# Patient Record
Sex: Male | Born: 1980 | Race: Asian | Hispanic: No | Marital: Married | Smoking: Current every day smoker
Health system: Southern US, Community
[De-identification: ages and names within clinical notes are randomized; demographics above are authoritative.]

## PROBLEM LIST (undated history)

## (undated) DIAGNOSIS — Z720 Tobacco use: Secondary | ICD-10-CM

## (undated) HISTORY — DX: Tobacco use: Z72.0

## (undated) HISTORY — PX: CHOLECYSTECTOMY: SHX55

---

## 2020-08-24 DIAGNOSIS — Z419 Encounter for procedure for purposes other than remedying health state, unspecified: Secondary | ICD-10-CM | POA: Diagnosis not present

## 2020-09-11 ENCOUNTER — Telehealth: Payer: Self-pay

## 2020-09-11 NOTE — Telephone Encounter (Signed)
Error

## 2020-09-11 NOTE — Congregational Nurse Program (Signed)
  Dept: [phone]   Congregational Nurse Program Note  Date of Encounter: 09/11/2020  Past Medical History: No past medical history on file.  Encounter Details: patient came to establish care with congregational nurse.Noted patient has appointment with refugee clinic on 11/13/20. Health education provided on diet and exercise.  Arman Bogus RN BSn PCCN  [facility] [phone]

## 2020-09-23 DIAGNOSIS — Z419 Encounter for procedure for purposes other than remedying health state, unspecified: Secondary | ICD-10-CM | POA: Diagnosis not present

## 2020-10-24 DIAGNOSIS — Z419 Encounter for procedure for purposes other than remedying health state, unspecified: Secondary | ICD-10-CM | POA: Diagnosis not present

## 2020-11-13 ENCOUNTER — Other Ambulatory Visit (HOSPITAL_COMMUNITY)
Admission: RE | Admit: 2020-11-13 | Discharge: 2020-11-13 | Disposition: A | Discharge status: Home / Self Care | Payer: Medicaid Other | Source: Ambulatory Visit | Attending: Family Medicine | Admitting: Family Medicine

## 2020-11-13 ENCOUNTER — Ambulatory Visit (INDEPENDENT_AMBULATORY_CARE_PROVIDER_SITE_OTHER): Payer: Medicaid Other | Admitting: Family Medicine

## 2020-11-13 ENCOUNTER — Other Ambulatory Visit: Payer: Self-pay

## 2020-11-13 VITALS — BP 126/74 | HR 74 | Ht 67.5 in | Wt 185.6 lb

## 2020-11-13 DIAGNOSIS — Z720 Tobacco use: Secondary | ICD-10-CM | POA: Diagnosis not present

## 2020-11-13 DIAGNOSIS — S0592XS Unspecified injury of left eye and orbit, sequela: Secondary | ICD-10-CM

## 2020-11-13 DIAGNOSIS — R9389 Abnormal findings on diagnostic imaging of other specified body structures: Secondary | ICD-10-CM | POA: Diagnosis not present

## 2020-11-13 DIAGNOSIS — Z0289 Encounter for other administrative examinations: Secondary | ICD-10-CM | POA: Insufficient documentation

## 2020-11-13 DIAGNOSIS — Z5941 Food insecurity: Secondary | ICD-10-CM

## 2020-11-13 DIAGNOSIS — Z131 Encounter for screening for diabetes mellitus: Secondary | ICD-10-CM

## 2020-11-13 DIAGNOSIS — Z114 Encounter for screening for human immunodeficiency virus [HIV]: Secondary | ICD-10-CM

## 2020-11-13 DIAGNOSIS — E639 Nutritional deficiency, unspecified: Secondary | ICD-10-CM | POA: Diagnosis not present

## 2020-11-13 DIAGNOSIS — Z1159 Encounter for screening for other viral diseases: Secondary | ICD-10-CM | POA: Diagnosis not present

## 2020-11-13 DIAGNOSIS — Z6825 Body mass index (BMI) 25.0-25.9, adult: Secondary | ICD-10-CM | POA: Diagnosis not present

## 2020-11-13 DIAGNOSIS — Z113 Encounter for screening for infections with a predominantly sexual mode of transmission: Secondary | ICD-10-CM

## 2020-11-13 LAB — POCT URINALYSIS DIP (MANUAL ENTRY)
Bilirubin, UA: NEGATIVE
Blood, UA: NEGATIVE
Glucose, UA: NEGATIVE mg/dL
Ketones, POC UA: NEGATIVE mg/dL
Leukocytes, UA: NEGATIVE
Nitrite, UA: NEGATIVE
Protein Ur, POC: NEGATIVE mg/dL
Spec Grav, UA: 1.025 (ref 1.010–1.025)
Urobilinogen, UA: 0.2 E.U./dL
pH, UA: 5.5 (ref 5.0–8.0)

## 2020-11-13 NOTE — Assessment & Plan Note (Signed)
Normal exam and no symptoms.  May have been artifact or transient infection.  Sputum cultures overseas were negative this makes tuberculosis much less likely.  Repeat chest x-ray ordered and we discussed with him how to get there.

## 2020-11-13 NOTE — Assessment & Plan Note (Signed)
Smokes 20 cigarettes of more per day and has smoked 24 years. Discussed the importance of quitting, will discuss further options at next appointment.

## 2020-11-13 NOTE — Assessment & Plan Note (Signed)
Discussed healthcare model.  Given food insecurity food box given.  Routine labs today.  Will need updated vaccines at follow-up.

## 2020-11-13 NOTE — Progress Notes (Signed)
  Patient Name: Donald Andrade Date of Birth: 1980/09/30 Date of Visit: 11/13/20 PCP: Lurline Del, DO  Chief Complaint: refugee intake examination and vision concerns.   The patient's preferred language is Arabic. An interpreter was used for the entire visit.  Interpreter Name or ID: Juanda Crumble   Subjective: Donald Andrade is a pleasant 40 y.o. presenting today for an initial refugee and immigrant clinic visit.   Concerns include history of kidney stones and difficulty of vision of left eye. No pain today in region of his kidneys  ROS: Poor vision in left eye. Has headaches on occasion but improve on own. Endorses dyspnea when sleeping, wakes him from sleep, happens once every month or two. No chest pain, does have some stomach bloating and some stomach pain.  PMH: Two times going to ER in [place] for stomach pain, never admitted. It has been better lately.   PSH: He had a perianal cyst I&D years ago. Gallbladder removed 2021.  FH: Mother diabetes and HTN - died from diabetes  Father HTN  Allergies:  None known   Current Medications:  none   Social History: Tobacco Use: "a bucket" of cigarettes per day. 20 per day. Started 24 years ago.  Alcohol Use: None  In the past two weeks, have you run out of food before you had money to purchase more? Yes  In the past two weeks, have you had difficulty with obtaining food for your family? 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 Dentition is poor . Appears well hydrated. Neck: Supple Cardiac: Regular rate and rhythm. Normal S1/S2. No murmurs, rubs, or gallops appreciated. Lungs: Clear bilaterally to ascultation.  Abdomen: Normoactive bowel sounds. Generalized mild abdominal discomfort. No rebound or guarding. Splenomegaly not present  Extremities: Warm, well perfused without edema.  Skin: No obvious rashes or lesions  Psych: Pleasant and appropriate  MSK: appropriate muscle tone    Food insecurity CCM referral place, food box given today  I have personally updated the history tabs within Epic and included the refugee information in social documentation.    Designated Advertising account planner signed with agency - Yes.   Release of information signed for Health Department - Yes.   Return to care in 1 month in [facility] with resident physician and PCP.   Vaccines: Hep B, MMR, Covid19 x2, Td

## 2020-11-13 NOTE — Assessment & Plan Note (Signed)
Hemoglobin A1c today.  The patient is at risk due to his family history and overweight status.

## 2020-11-13 NOTE — Assessment & Plan Note (Signed)
CCM referral place, food box given today

## 2020-11-13 NOTE — Patient Instructions (Addendum)
It was wonderful to see you today.  Please bring ALL of your medications with you to every visit.   Today we talked about:  -- Calling the eye doctor for an appointment--we will talk with Hollice Espy about this  --Calling the dentist for an appointment  --  We placed a referral for our CCM group to assist with food difficulties and are giving you a food box.    --At our next appointment we will talk more about quitting smoking. It is important to cut down on the number of cigarettes you are smoking per day    Thank you for choosing [facility].   Please call [phone] with any questions about today's appointment.  Please be sure to schedule follow up at the front  desk before you leave today.   Terisa Starr, MD  Family Medicine

## 2020-11-14 LAB — URINE CYTOLOGY ANCILLARY ONLY
Chlamydia: NEGATIVE
Comment: NEGATIVE
Comment: NORMAL
Neisseria Gonorrhea: NEGATIVE

## 2020-11-15 ENCOUNTER — Telehealth: Payer: Self-pay | Admitting: Family Medicine

## 2020-11-15 NOTE — Telephone Encounter (Signed)
Called to schedule patient's eye appointment.  This was scheduled at [facility] eye care.  Date is August 24.  Time is 8:15 AM.  Address is [address] [place].  Dr. Atha Starks can you please give the patient this information at his follow-up visit.  I already informed his caseworker.  Terisa Starr, MD  Family Medicine Teaching Service

## 2020-11-16 ENCOUNTER — Ambulatory Visit
Admission: RE | Admit: 2020-11-16 | Discharge: 2020-11-16 | Disposition: A | Discharge status: Home / Self Care | Payer: Medicaid Other | Source: Ambulatory Visit | Attending: Family Medicine | Admitting: Family Medicine

## 2020-11-16 DIAGNOSIS — R9389 Abnormal findings on diagnostic imaging of other specified body structures: Secondary | ICD-10-CM

## 2020-11-16 DIAGNOSIS — Z720 Tobacco use: Secondary | ICD-10-CM

## 2020-11-17 LAB — LIPID PANEL
Chol/HDL Ratio: 5.4 ratio — ABNORMAL HIGH (ref 0.0–5.0)
Cholesterol, Total: 147 mg/dL (ref 100–199)
HDL: 27 mg/dL — ABNORMAL LOW (ref >39)
LDL Chol Calc (NIH): 84 mg/dL (ref 0–99)
Triglycerides: 210 mg/dL — ABNORMAL HIGH (ref 0–149)
VLDL Cholesterol Cal: 36 mg/dL (ref 5–40)

## 2020-11-17 LAB — CBC WITH DIFFERENTIAL/PLATELET
Basophils Absolute: 0.1 10*3/uL (ref 0.0–0.2)
Basos: 1 %
EOS (ABSOLUTE): 0.4 10*3/uL (ref 0.0–0.4)
Eos: 6 %
Hematocrit: 45.8 % (ref 37.5–51.0)
Hemoglobin: 15.6 g/dL (ref 13.0–17.7)
Immature Grans (Abs): 0 10*3/uL (ref 0.0–0.1)
Immature Granulocytes: 0 %
Lymphocytes Absolute: 2.6 10*3/uL (ref 0.7–3.1)
Lymphs: 35 %
MCH: 29.2 pg (ref 26.6–33.0)
MCHC: 34.1 g/dL (ref 31.5–35.7)
MCV: 86 fL (ref 79–97)
Monocytes Absolute: 0.4 10*3/uL (ref 0.1–0.9)
Monocytes: 6 %
Neutrophils Absolute: 3.8 10*3/uL (ref 1.4–7.0)
Neutrophils: 52 %
Platelets: 218 10*3/uL (ref 150–450)
RBC: 5.34 x10E6/uL (ref 4.14–5.80)
RDW: 13.1 % (ref 11.6–15.4)
WBC: 7.3 10*3/uL (ref 3.4–10.8)

## 2020-11-17 LAB — COMPREHENSIVE METABOLIC PANEL
ALT: 21 IU/L (ref 0–44)
AST: 18 IU/L (ref 0–40)
Albumin/Globulin Ratio: 2.3 — ABNORMAL HIGH (ref 1.2–2.2)
Albumin: 4.9 g/dL (ref 4.0–5.0)
Alkaline Phosphatase: 92 IU/L (ref 44–121)
BUN/Creatinine Ratio: 17 (ref 9–20)
BUN: 12 mg/dL (ref 6–20)
Bilirubin Total: 0.5 mg/dL (ref 0.0–1.2)
CO2: 24 mmol/L (ref 20–29)
Calcium: 9.7 mg/dL (ref 8.7–10.2)
Chloride: 100 mmol/L (ref 96–106)
Creatinine, Ser: 0.72 mg/dL — ABNORMAL LOW (ref 0.76–1.27)
Globulin, Total: 2.1 g/dL (ref 1.5–4.5)
Glucose: 91 mg/dL (ref 65–99)
Potassium: 4.4 mmol/L (ref 3.5–5.2)
Sodium: 140 mmol/L (ref 134–144)
Total Protein: 7 g/dL (ref 6.0–8.5)
eGFR: 120 mL/min/{1.73_m2} (ref >59)

## 2020-11-17 LAB — HIV ANTIBODY (ROUTINE TESTING W REFLEX): HIV Screen 4th Generation wRfx: NONREACTIVE

## 2020-11-17 LAB — TSH: TSH: 1.2 u[IU]/mL (ref 0.450–4.500)

## 2020-11-17 LAB — HCV AB W REFLEX TO QUANT PCR: HCV Ab: <0.1 s/co ratio (ref 0.0–0.9)

## 2020-11-17 LAB — HCV INTERPRETATION

## 2020-11-17 LAB — HGB FRACTIONATION CASCADE
Hgb A2: 2.8 % (ref 1.8–3.2)
Hgb A: 97.2 % (ref 96.4–98.8)
Hgb F: 0 % (ref 0.0–2.0)
Hgb S: 0 %

## 2020-11-17 LAB — HEMOGLOBIN A1C
Est. average glucose Bld gHb Est-mCnc: 114 mg/dL
Hgb A1c MFr Bld: 5.6 % (ref 4.8–5.6)

## 2020-11-17 LAB — HEPATITIS B SURFACE ANTIBODY, QUANTITATIVE: Hepatitis B Surf Ab Quant: 24.2 m[IU]/mL (ref >9.9)

## 2020-11-17 LAB — QUANTIFERON-TB GOLD PLUS
QuantiFERON Mitogen Value: >10 IU/mL
QuantiFERON Nil Value: 0.03 IU/mL
QuantiFERON TB1 Ag Value: 0.03 IU/mL
QuantiFERON TB2 Ag Value: 0.04 IU/mL
QuantiFERON-TB Gold Plus: NEGATIVE

## 2020-11-17 LAB — VARICELLA ZOSTER ANTIBODY, IGG: Varicella zoster IgG: 1369 index (ref >165)

## 2020-11-17 LAB — HEPATITIS B SURFACE ANTIGEN: Hepatitis B Surface Ag: NEGATIVE

## 2020-11-17 LAB — HEPATITIS B CORE ANTIBODY, TOTAL: Hep B Core Total Ab: NEGATIVE

## 2020-11-17 LAB — RPR: RPR Ser Ql: NONREACTIVE

## 2020-11-19 ENCOUNTER — Telehealth: Payer: Self-pay | Admitting: Family Medicine

## 2020-11-19 NOTE — Telephone Encounter (Signed)
   Telephone encounter was:  Successful.  11/19/2020 Name: [name] MRN: [id number] DOB: Oct 06, 1980  Donald Andrade is a 39 y.o. year old male who is a primary care patient of Jackelyn Poling, DO . The community resource team was consulted for assistance with Food Insecurity  Care guide performed the following interventions: Patient provided with information about care guide support team and interviewed to confirm resource needs Discussed resources to assist with local food banks. I gave pt four locations to call for assistance. He confirmed he would have a friend translate .  Follow Up Plan:  No further follow up planned at this time. The patient has been provided with needed resources.  April Green Care Guide, Embedded Care Coordination [facility], Care Management Phone: [phone] Email: [email or web address]

## 2020-11-23 DIAGNOSIS — Z419 Encounter for procedure for purposes other than remedying health state, unspecified: Secondary | ICD-10-CM | POA: Diagnosis not present

## 2020-12-03 ENCOUNTER — Ambulatory Visit: Payer: Medicaid Other | Admitting: Family Medicine

## 2020-12-24 DIAGNOSIS — Z419 Encounter for procedure for purposes other than remedying health state, unspecified: Secondary | ICD-10-CM | POA: Diagnosis not present

## 2020-12-25 NOTE — Progress Notes (Deleted)
    SUBJECTIVE:   CHIEF COMPLAINT / HPI:   Lab follow-up: Patient is a 40 year old male presenting today for follow-up after initial labs which were collected during his previous visit. During his previous encounter we checked a CBC, lipid profile, CMP, hemoglobin electrophoresis, A1c, TSH, chlamydia/gonorrhea/RPR, QuantiFERON gold, varicella-zoster, hep B panel, hepatitis C, HIV, urinalysis, and chest x-ray were performed.  There were no gross abnormalities with any of these labs other than an HDL of 27 and moderately elevated triglycerides, though this was not fasted lab.  Tobacco cessation: Was previously smoking 20+ cigarettes per day with a 24-year history of smoking.  At the last appointment we did discuss reducing this.  Today he states he is currently smoking***.  Epigastric pain: Was discussed at previous encounter.  Seems consistent with reflux.***.  PERTINENT  PMH / PSH: None relevant  OBJECTIVE:   There were no vitals taken for this visit. ***  General: NAD, pleasant, able to participate in exam Cardiac: RRR, no murmurs. Respiratory: CTAB, normal effort Abdomen: Bowel sounds present, nontender, nondistended, no hepatosplenomegaly. Psych: Normal affect and mood   ASSESSMENT/PLAN:   No problem-specific Assessment & Plan notes found for this encounter.   Recommended diet and exercise for reduced HDL.  Also discussed smoking cessation.  Discussed smoking cessation.  Patient currently smoking***. Pneumococcal 20 vaccine given***  Has eye doctor follow-up on August 24.  Discussed this with the patient and have placed this in his AVS.  We will check H. pylori screen for concern for H. pylori causing gastritis as the cause of patient's epigastric discomfort.  Can consider starting omeprazole for 8 weeks after screen is complete***   Jackelyn Poling, DO [facility]    {    This will disappear when note is signed, click to select method of visit     :1}

## 2020-12-26 ENCOUNTER — Ambulatory Visit: Payer: Medicaid Other | Admitting: Family Medicine

## 2020-12-26 DIAGNOSIS — S0592XS Unspecified injury of left eye and orbit, sequela: Secondary | ICD-10-CM

## 2020-12-26 DIAGNOSIS — Z720 Tobacco use: Secondary | ICD-10-CM

## 2020-12-26 DIAGNOSIS — R748 Abnormal levels of other serum enzymes: Secondary | ICD-10-CM

## 2020-12-26 DIAGNOSIS — Z09 Encounter for follow-up examination after completed treatment for conditions other than malignant neoplasm: Secondary | ICD-10-CM

## 2021-01-15 NOTE — Progress Notes (Signed)
    SUBJECTIVE:   CHIEF COMPLAINT / HPI:   Lab follow-up: Patient is a 40 year old male presenting today for follow-up after initial labs which were collected during his previous visit. During his previous encounter we checked a CBC, lipid profile, CMP, hemoglobin electrophoresis, A1c, TSH, chlamydia/gonorrhea/RPR, QuantiFERON gold, varicella-zoster, hep B panel, hepatitis C, HIV, urinalysis, and chest x-ray were performed.  There were no gross abnormalities with any of these labs other than an HDL of 27 and moderately elevated triglycerides, though this was not fasted lab.  Tobacco cessation: Was previously smoking 20+ cigarettes per day with a 24-year history of smoking.  At the last appointment we did discuss reducing this.  Today he states he is currently smoking about 20 cigarettes per day.  Epigastric pain: Was discussed at previous encounter.  Burning sensation that often happens after he eats, especially with spicy foods. Seems consistent with reflux. No associated dyspnea. Plan to check H pylori screen today.  PERTINENT  PMH / PSH: None relevant  OBJECTIVE:   BP 123/70   Pulse 76   Ht 5' 7.5" (1.715 m)   Wt 186 lb 3.2 oz (84.5 kg)   SpO2 100%   BMI 28.73 kg/m    General: NAD, pleasant, able to participate in exam Chest/cardiac: RRR, no murmurs.  Mild epigastric discomfort with palpation.  Patient states this reproduces his pain. Respiratory: CTAB, normal effort Abdomen: Bowel sounds present, nontender, nondistended, no hepatosplenomegaly. Psych: Normal affect and mood   ASSESSMENT/PLAN:   Epigastric discomfort We will check H. pylori screen for concern for H. pylori causing gastritis as the cause of patient's epigastric discomfort.  Will start omeprazole for 8 weeks after screen is complete.  I have sent this to his pharmacy.  Tobacco use Discussed smoking cessation options and techniques both working on reducing the nicotine cravings and the habits of smoking.  Spoke  with the patient in great detail about various modalities for reducing smoking.  Patient currently smoking 20+ cigarettes per day. Pneumococcal 20 vaccine given. Will start nicotine patches and gum.  Have discussed in detail how to use these appropriately.  Patient will use a nicotine patch each day and use gum as needed to reduce cravings.  I discussed the proper use of the nicotine gum with him via interpreter.  Follow up in 1 month.   Reduced HDL: Recommended diet and exercise for reduced HDL.  Also discussed smoking cessation.  Vision abnormalities: Had eye doctor follow-up this morning which he states went well with him being prescribed glasses.   Jackelyn Poling, DO [facility]

## 2021-01-16 ENCOUNTER — Ambulatory Visit (INDEPENDENT_AMBULATORY_CARE_PROVIDER_SITE_OTHER): Payer: Medicaid Other | Admitting: Family Medicine

## 2021-01-16 ENCOUNTER — Other Ambulatory Visit (HOSPITAL_COMMUNITY): Payer: Self-pay

## 2021-01-16 ENCOUNTER — Encounter: Payer: Self-pay | Admitting: Family Medicine

## 2021-01-16 ENCOUNTER — Other Ambulatory Visit: Payer: Self-pay

## 2021-01-16 VITALS — BP 123/70 | HR 76 | Ht 67.5 in | Wt 186.2 lb

## 2021-01-16 DIAGNOSIS — E786 Lipoprotein deficiency: Secondary | ICD-10-CM

## 2021-01-16 DIAGNOSIS — F17200 Nicotine dependence, unspecified, uncomplicated: Secondary | ICD-10-CM

## 2021-01-16 DIAGNOSIS — H539 Unspecified visual disturbance: Secondary | ICD-10-CM

## 2021-01-16 DIAGNOSIS — R1013 Epigastric pain: Secondary | ICD-10-CM | POA: Insufficient documentation

## 2021-01-16 DIAGNOSIS — Z720 Tobacco use: Secondary | ICD-10-CM

## 2021-01-16 DIAGNOSIS — Z23 Encounter for immunization: Secondary | ICD-10-CM | POA: Diagnosis not present

## 2021-01-16 MED ORDER — NICOTINE 21 MG/24HR TD PT24
21.0000 mg | MEDICATED_PATCH | Freq: Every morning | TRANSDERMAL | 1 refills | Status: AC
  Filled 2021-01-16: qty 14, 14d supply, fill #0

## 2021-01-16 MED ORDER — NICOTINE POLACRILEX 4 MG MT GUM: 4.0000 mg | CHEWING_GUM | OROMUCOSAL | 0 refills | Status: AC | PRN

## 2021-01-16 MED ORDER — NICOTINE POLACRILEX 4 MG MT GUM
4.0000 mg | CHEWING_GUM | OROMUCOSAL | 1 refills | Status: AC | PRN
  Filled 2021-01-16: qty 110, 12d supply, fill #0

## 2021-01-16 MED ORDER — NICOTINE 21 MG/24HR TD PT24: 21.0000 mg | MEDICATED_PATCH | Freq: Every day | TRANSDERMAL | 0 refills | Status: AC

## 2021-01-16 MED ORDER — OMEPRAZOLE 20 MG PO CPDR
20.0000 mg | DELAYED_RELEASE_CAPSULE | Freq: Every day | ORAL | 0 refills | Status: AC
  Filled 2021-01-16: qty 60, 60d supply, fill #0

## 2021-01-16 NOTE — Assessment & Plan Note (Signed)
We will check H. pylori screen for concern for H. pylori causing gastritis as the cause of patient's epigastric discomfort.  Will start omeprazole for 8 weeks after screen is complete.  I have sent this to his pharmacy.

## 2021-01-16 NOTE — Patient Instructions (Addendum)
We are starting the nicotine patches and the nicotine gum.  I would like to use the patch each day and you can chew a piece of nicotine gum anytime you have a craving for smoking.  I would like to start this on the first day that she would like to quit smoking and try to avoid using any cigarettes throughout the day.  Remember with the gum the more you chew it to the more nicotine it releases to reduce those cravings.  I would like to see back in 1 month  We are going to check a lab today to see if you have a bacteria that can cause the burning in your stomach.  I will let you know the result of this when it returns.  We are going to start a medication to help with your stomach burning in the meantime.  We will use this medication for at least 2 months.  You will take it each day.  I have sent this to the pharmacy.

## 2021-01-16 NOTE — Assessment & Plan Note (Addendum)
Discussed smoking cessation options and techniques both working on reducing the nicotine cravings and the habits of smoking.  Spoke with the patient in great detail about various modalities for reducing smoking.  Patient currently smoking 20+ cigarettes per day. Pneumococcal 20 vaccine given. Will start nicotine patches and gum.  Have discussed in detail how to use these appropriately.  Patient will use a nicotine patch each day and use gum as needed to reduce cravings.  I discussed the proper use of the nicotine gum with him via interpreter.  Follow up in 1 month.

## 2021-01-16 NOTE — Addendum Note (Signed)
Addended by: Cleatrice Burke A on: 01/16/2021 04:04 PM   Modules accepted: Orders

## 2021-01-18 LAB — H. PYLORI BREATH TEST: H pylori Breath Test: POSITIVE — AB

## 2021-01-21 ENCOUNTER — Other Ambulatory Visit (HOSPITAL_COMMUNITY): Payer: Self-pay

## 2021-01-21 ENCOUNTER — Other Ambulatory Visit: Payer: Self-pay | Admitting: Family Medicine

## 2021-01-21 DIAGNOSIS — A048 Other specified bacterial intestinal infections: Secondary | ICD-10-CM

## 2021-01-21 MED ORDER — PYLERA 140-125-125 MG PO CAPS
3.0000 | ORAL_CAPSULE | Freq: Three times a day (TID) | ORAL | 0 refills | Status: AC
  Filled 2021-01-21: qty 120, 10d supply, fill #0

## 2021-01-21 NOTE — Progress Notes (Signed)
Called patient with use of arabic interpretor [id number].   Explained to patient that he was positive for H Pylori as we expected. I explained that I had ordered a group of medications for him to treat this infection and that we had set up a pharmacy appointment with Dr. Nicholaus Bloom on Tuesday September 6th at 11am to discuss his treatment. He plans to pick up the medication from the [facility] pharmacy and will start treatment after coming to the pharmacy appointment on September 6th.

## 2021-01-24 DIAGNOSIS — Z419 Encounter for procedure for purposes other than remedying health state, unspecified: Secondary | ICD-10-CM | POA: Diagnosis not present

## 2021-01-29 ENCOUNTER — Other Ambulatory Visit (HOSPITAL_COMMUNITY): Payer: Self-pay

## 2021-01-29 ENCOUNTER — Other Ambulatory Visit: Payer: Self-pay

## 2021-01-29 ENCOUNTER — Ambulatory Visit (INDEPENDENT_AMBULATORY_CARE_PROVIDER_SITE_OTHER): Payer: Medicaid Other | Admitting: Pharmacist

## 2021-01-29 DIAGNOSIS — A048 Other specified bacterial intestinal infections: Secondary | ICD-10-CM

## 2021-01-29 NOTE — Patient Instructions (Addendum)
???? ?????? ??? ?????! ?????? ????? ?? ?????? ???????. ????? ????? ???? ?????? ??? ???? ???? ?? ?????. ????? ???? ??????? ??? ??????? ? ????? ??????? ??? ?????? ? ????? ??????? ??? ?????? ? ????? ??????? ??? ?????. ????? ????? ?????? ??????? ???? ???? ?? ?????? ??? ?????? ?????? ?? ?????? ??? ????? ?????? ? ?????? ????? ??????. ????? ?????? ?? ??? ??? ????? ?????? ?????? ??? ??????? ???? ?????? ????   10 ????. ????? ?????? ?????? ?????? ? ????? ?????? ??? ????? ??? ?????? ??? ????? ?? ?????.   ??? ??? ???? ?? ????? ?????? ?? ??? ????? ??? ?? ?????? ?????? ? ???? ??????? ???????? ??? [phone]  sarirt biruyatik sayid kanean! tahadathna alyawm ean 'adwiatik aljadidati. satakhudh alshakhs aladhi 'ahdarath maeak 'arbae maraat fi alyawmi. satakhudh thalath kabsulat baed al'iiftar , wathalath kabsulat baed alghada' , wathalath kabsulat baed aleasha' , wathalath kabsulat qabl alnuwmi. satakhudh aydan aldawa' al'iidafia aladhi ladayk fi almanzil wahu kabsulat wbdlaan min tanawulih maratan wahidatan ywmyan , satakhudhuh maratayn ywmyan. [place]ul[place] fi nafs waqt tanawul aldawa' aljadid baed al'iiftar wabaed aleasha' limudat 10 'ayaamu. bimujarad aiktimal al'ayaam aleashrat , [place] aleawdat 'iilaa tanawul hadha aldawa' maratan wahidatan fi alyawmi. 'iidha kan ladayk 'ayu 'asyilat 'iidafiat 'aw kunt bihajat 'iilaa 'ayi musaeadat 'iidafiat , yurjaa alaitisal bialeiadat ealaa [phone]  It was nice seeing you Donald Andrade!  Today we talked about your new medications. You will take the one you brought with you four times a day. You will take three capsules after breakfast, three capsules after lunch, three capsules after dinner, and three capsules at bedtime. You will also take the additional medication you have at home that is a capsule and instead of taking once a day you will take it twice a day. You can take it at the same time as the new medication after breakfast and after dinner for 10 days. Once the  10 days are completed you can go back to taking this medication once a day.    If you have any additional questions or need any additional help please call the clinic at [phone]

## 2021-01-29 NOTE — Progress Notes (Signed)
   Subjective:    Patient ID: Donald Andrade, male    DOB: April 07, 1981, 40 y.o.   MRN: [id number]  HPI  Patient is a 40 y.o. male who presents for medication review for H. Pylori treatment. He is in good spirits and presents without assistance. Utilized translator [phone]. Patient was referred on 01/21/21 and last seen by Primary Care Provider on 01/16/21.  Patient presents with Pylera pill packages. Patient has not begun treatment yet as instructed by primary care provider to wait until appointment today.   Objective:   Labs:   Physical Exam Neurological:     Mental Status: He is alert and oriented to person, place, and time.    Assessment/Plan:   Patient educated on purpose, proper use and potential adverse effects of Pylera. Instructed patient to take omeprazole 20mg  twice a day alongside Pylera for 10 days. Following instruction patient verbalized understanding of treatment plan.   Follow-up appointment with provider as necessary. Written patient instructions provided.  This appointment required 25 minutes of direct patient care.  Thank you for involving pharmacy to assist in providing this patient's care.  Patient seen with , PharmD Candidate

## 2021-02-23 DIAGNOSIS — Z419 Encounter for procedure for purposes other than remedying health state, unspecified: Secondary | ICD-10-CM | POA: Diagnosis not present

## 2021-03-26 DIAGNOSIS — Z419 Encounter for procedure for purposes other than remedying health state, unspecified: Secondary | ICD-10-CM | POA: Diagnosis not present

## 2021-04-25 DIAGNOSIS — Z419 Encounter for procedure for purposes other than remedying health state, unspecified: Secondary | ICD-10-CM | POA: Diagnosis not present

## 2021-05-09 ENCOUNTER — Other Ambulatory Visit: Payer: Self-pay

## 2021-05-09 ENCOUNTER — Other Ambulatory Visit (HOSPITAL_COMMUNITY)
Admission: RE | Admit: 2021-05-09 | Discharge: 2021-05-09 | Disposition: A | Discharge status: Home / Self Care | Payer: Medicaid Other | Source: Ambulatory Visit | Attending: Family Medicine | Admitting: Family Medicine

## 2021-05-09 ENCOUNTER — Ambulatory Visit (INDEPENDENT_AMBULATORY_CARE_PROVIDER_SITE_OTHER): Payer: Medicaid Other | Admitting: Family Medicine

## 2021-05-09 ENCOUNTER — Other Ambulatory Visit (HOSPITAL_COMMUNITY): Payer: Self-pay

## 2021-05-09 VITALS — BP 139/85 | HR 91 | Ht 67.5 in | Wt 189.8 lb

## 2021-05-09 DIAGNOSIS — Z113 Encounter for screening for infections with a predominantly sexual mode of transmission: Secondary | ICD-10-CM | POA: Diagnosis not present

## 2021-05-09 DIAGNOSIS — R37 Sexual dysfunction, unspecified: Secondary | ICD-10-CM | POA: Diagnosis not present

## 2021-05-09 DIAGNOSIS — Z77011 Contact with and (suspected) exposure to lead: Secondary | ICD-10-CM

## 2021-05-09 MED ORDER — SILDENAFIL CITRATE 50 MG PO TABS
50.0000 mg | ORAL_TABLET | Freq: Every day | ORAL | 0 refills | Status: DC | PRN
  Filled 2021-05-09: qty 10, 30d supply, fill #0

## 2021-05-09 NOTE — Progress Notes (Signed)
° °  SUBJECTIVE:   CHIEF COMPLAINT / HPI:    A Arabic interpreter was used for this encounter:  Name: Ahmed ID #[id number]   Donald Andrade is a 40 y.o. male here to discuss sexual problem.   Pt reports it takes a long time to have sex. He does not feel the urge to have sex any longer. "No lust to have sex" and has difficulty getting an erection for the past month and a half. Has had been thinking about the war back home and family.  Does have a morning erection but not its not as hard as it use to be. He can get an erection but loses his erection during sex with his wife. This has been going on for a while now.  He ejaculated last week and felt a pulling pain in his right testicle. Notes this has been occurring for a while as well. Denies dysuria, abdominal pain, hematuria, fever, back pain.   He worked in [employer] for the past 4 months which told them he was been exposed to lead for the past 4 months. Request lead testing.     PERTINENT  PMH / PSH: reviewed and updated as appropriate   OBJECTIVE:   BP 139/85    Pulse 91    Ht 5' 7.5" (1.715 m)    Wt 189 lb 12.8 oz (86.1 kg)    SpO2 99%    BMI 29.29 kg/m    Physical Exam Vitals reviewed.  Constitutional:      Appearance: Normal appearance. He is not ill-appearing.  Cardiovascular:     Rate and Rhythm: Normal rate.  Pulmonary:     Effort: Pulmonary effort is normal. No respiratory distress.  Abdominal:     Hernia: There is no hernia in the left inguinal area or right inguinal area.  Genitourinary:    Pubic Area: No rash.      Penis: Normal and circumcised. No tenderness, discharge, swelling or lesions.      Testes: Normal. Cremasteric reflex is present.        Right: Mass or tenderness not present.        Left: Mass or tenderness not present.     Epididymis:     Right: No tenderness.     Left: No tenderness.  Neurological:     Mental Status: He is alert.     ASSESSMENT/PLAN:   Sexual dysfunction Obtain  testosterone level, GC/CT, RPR, HIV testing. No tenderness or hernias, on exam.  Highly suspicious this may be supratentorial given refugee status and reports of thinking of the war in the past. Discussed trial of erectile dysfunction medication and pt agreeable. Rx for Viagra 50 mg sent to pharmacy. Reviewed medication instructions and adverse effects with patient while interpreter present.     Lead Exposure  Given workplace exposure obtain venous lead sample and CBC.   Katha Cabal, DO PGY-3, [facility] 05/10/2021

## 2021-05-09 NOTE — Patient Instructions (Signed)
???? ??? ???????? ???? ??????? ?????? ?????? ??. ?? ????? ?????? ??? ?????? ?????. ° °??? ??? ???? ??? ??? ????? ?? ??? ? ????? ??. ° °?? ????? ?? ??????? ?? ???   [phone].  ?????    Stop by the pharmacy to pick up your prescriptions.  Take 1 tablet one hour prior to sex.   If something is abnormal with your bloodwork, I will call you.    Feel free to call me at [phone].   Take care

## 2021-05-10 DIAGNOSIS — R37 Sexual dysfunction, unspecified: Secondary | ICD-10-CM | POA: Insufficient documentation

## 2021-05-10 LAB — URINE CYTOLOGY ANCILLARY ONLY
Chlamydia: NEGATIVE
Comment: NEGATIVE
Comment: NEGATIVE
Comment: NORMAL
Neisseria Gonorrhea: NEGATIVE
Trichomonas: NEGATIVE

## 2021-05-10 NOTE — Assessment & Plan Note (Signed)
Obtain testosterone level, GC/CT, RPR, HIV testing. No tenderness or hernias, on exam.  Highly suspicious this may be supratentorial given refugee status and reports of thinking of the war in the past. Discussed trial of erectile dysfunction medication and pt agreeable. Rx for Viagra 50 mg sent to pharmacy. Reviewed medication instructions and adverse effects with patient while interpreter present.

## 2021-05-12 LAB — CBC
Hematocrit: 42.9 % (ref 37.5–51.0)
Hemoglobin: 15.1 g/dL (ref 13.0–17.7)
MCH: 28.9 pg (ref 26.6–33.0)
MCHC: 35.2 g/dL (ref 31.5–35.7)
MCV: 82 fL (ref 79–97)
Platelets: 260 10*3/uL (ref 150–450)
RBC: 5.23 x10E6/uL (ref 4.14–5.80)
RDW: 12.6 % (ref 11.6–15.4)
WBC: 7.2 10*3/uL (ref 3.4–10.8)

## 2021-05-12 LAB — LEAD, BLOOD (ADULT >= 16 YRS): Lead-Whole Blood: 57.1 ug/dL (ref 0.0–3.4)

## 2021-05-12 LAB — TESTOSTERONE, FREE, DIRECT
Testosterone, Free: 6.2 pg/mL — ABNORMAL LOW (ref 8.7–25.1)
Testosterone, Total, LC/MS: 346.9 ng/dL (ref 264.0–916.0)

## 2021-05-12 LAB — HIV ANTIBODY (ROUTINE TESTING W REFLEX): HIV Screen 4th Generation wRfx: NONREACTIVE

## 2021-05-13 ENCOUNTER — Other Ambulatory Visit: Payer: Self-pay

## 2021-05-13 ENCOUNTER — Telehealth: Payer: Self-pay

## 2021-05-13 ENCOUNTER — Other Ambulatory Visit (HOSPITAL_COMMUNITY): Payer: Self-pay

## 2021-05-13 ENCOUNTER — Ambulatory Visit (INDEPENDENT_AMBULATORY_CARE_PROVIDER_SITE_OTHER): Payer: Medicaid Other | Admitting: Family Medicine

## 2021-05-13 ENCOUNTER — Encounter: Payer: Self-pay | Admitting: Family Medicine

## 2021-05-13 VITALS — BP 123/71 | HR 88 | Ht 67.5 in | Wt 191.1 lb

## 2021-05-13 DIAGNOSIS — Z77011 Contact with and (suspected) exposure to lead: Secondary | ICD-10-CM

## 2021-05-13 MED ORDER — CALCIUM GLUCONATE 500 MG PO TABS
1.0000 | ORAL_TABLET | Freq: Three times a day (TID) | ORAL | 1 refills | Status: AC
  Filled 2021-05-13: qty 90, 30d supply, fill #0

## 2021-05-13 NOTE — Progress Notes (Signed)
° ° °  SUBJECTIVE:   CHIEF COMPLAINT / HPI: discuss recent labs  Patient recently seen for decreased libido and concern for lead exposure.  Recent Lead noted to be 57.1.  Free Testosterone low 6.2.  Viagra helping.  Patient works at [employer] and exposed to lead.  Denies any nausea/vomiting, confusion or weakness.  Reports some tingling in fingers bilaterally.  PERTINENT  PMH / PSH:  Recurrent exposure Lead  OBJECTIVE:   BP 123/71    Pulse 88    Ht 5' 7.5" (1.715 m)    Wt 191 lb 2 oz (86.7 kg)    SpO2 99%    BMI 29.49 kg/m    General: Alert, no acute distress Cardio: Normal S1 and S2, RRR, no r/m/g Pulm: CTAB, normal work of breathing Abdomen: Bowel sounds normal. Abdomen soft and non-tender.  Extremities: No peripheral edema.  Neuro: Cranial nerves grossly intact   ASSESSMENT/PLAN:   Lead exposure Lead levels elevated 57.1.  Called poison control, who discussed with toxicologist, Dr Cyndia Bent Coty, and recommended to have patient follow up with employer for parameters.  Called [employer]SHA and spoke with Clydie Braun, RN, who recommended to start patient on Calcium and have family tested for lead exposure.  She will follow up with patient and company.  Recomended to follow up with patient in 1 month for repeat lea level. Discussed with patient importance of removal from area of lead exposure at work.  Can apply for workers comp if unable to work in safe area, per [employer]. Encouraged patient to shower immediately after work to avoid transfer to family. Educated that should wait until lead levels below 30 for family planning and likely testosterone levels will return to normal once exposure limited CMet today  Calcium 500 mg TID Follow up with PCP in 1 month   Due to language barrier, an interpreter was present during the history-taking and subsequent discussion (and for part of the physical exam) with this patient.   Dana Allan, MD [facility]

## 2021-05-13 NOTE — Telephone Encounter (Signed)
Patient calls nurse line requesting to speak with nurse regarding recent lab results. Prior to speaking with nursing staff, patient scheduled follow up appointment in clinic to discuss lab results and next steps.   Advised patient that results had not yet been reviewed by provider, therefore, he can keep his appointment this afternoon to discuss further.   Will forward to Dr. Clent Ridges as she is seeing patient this afternoon.   Veronda Prude, RN

## 2021-05-13 NOTE — Telephone Encounter (Signed)
Donald Andrade with [facility] calls nurse line to reports lead level of 57.1. Donald Andrade reports she attempted to reach out to the patient to discuss possible exposures, however was not able to reach patient.   Patient has an apt scheduled with Clent Ridges this afternoon. Donald Andrade reports more than likely the high value is from spices in cooking, however where he works could also be a factor and a "question worth asking." Information given on arrival to [place] from [place].   Will forward to provider who saw patient and Clent Ridges for afternoon apt.

## 2021-05-13 NOTE — Patient Instructions (Addendum)
Thank you for coming to see me today. It was a pleasure. Today we talked about:   Call the Clydie Braun at [facility] Department at [phone] and tell them you are returning a call to discuss high lead level found in your blood.  Follow up with your employer to see if they have any parameters or guidelines for lead exposure.  Start Calcium 500 mg three times a day  You will need to have your children tested for led exposure Please make appointments for blood work  We will get some labs today.  If they are abnormal or we need to do something about them, I will call you.  If they are normal, I will send you a message on MyChart (if it is active) or a letter in the mail.  If you don't hear from [place] in 2 weeks, please call the office at the number below.   Important to work in different area to limit lead exposure.   Please follow-up with PCP in 1 month for repeat blood work  If you have any questions or concerns, please do not hesitate to call the office at [phone].  Best,   Dana Allan, MD    Lead Blood Test Why am I having this test? Lead is a substance that occurs naturally in the earth and is very poisonous (toxic) to humans. Exposure to high amounts of lead can result in lead building up in tissues throughout the body, especially in the brain and nervous system. This is called lead poisoning. This test may be done: As a screening test for children who do not have symptoms of lead poisoning. This is often done to detect lead exposure as early as possible to prevent lead poisoning. If you have possible symptoms of lead poisoning, such as: Mood changes. Frequent headaches. Repeated miscarriages or early (preterm) deliveries, if applicable. Signs of a learning impairment or developmental delay in children. Decreased mental function. Muscle weakness. Memory loss. If you have already been diagnosed with lead poisoning, and your health care provider is monitoring your  treatment. What is being tested? This test measures the amount of lead in the blood. What kind of sample is taken? A blood sample is required for this test. It is usually collected by inserting a needle into a blood vessel. Tell a health care provider about: Any allergies you have. All medicines you are taking, including vitamins, herbs, eye drops, creams, and over-the-counter medicines. Any blood disorders you have. Any surgeries you have had. Any medical conditions you have. Whether you are pregnant or may be pregnant. How are the results reported? Your test results will be reported as a value that tells you how much lead is in your blood. This will be given as micrograms of lead per deciliter of blood (mcg/dL). Your health care provider will compare your results to normal values that were established after testing a large group of people (reference values). Reference values may vary among labs and hospitals. For this test, a common reference value is less than 5 mcg/dL. What do the results mean? For children: A result of 0 mcg/dL is considered negative, meaning that there is no lead in the blood. If your child's result is higher than the reference value, this may mean that your child has been exposed to lead recently, or that he or she has lead poisoning. Your health care provider will work with you to develop a plan to monitor and treat your child and to prevent  continued or future exposure of your child to lead. For adults: A result of 0 mcg/dL is considered negative, meaning that there is no lead in the blood. If your result is lower than the reference value, this means that you have an acceptable amount of lead in your body. This may mean that you do not have lead poisoning, or that your treatment for lead poisoning is working well. If your result is higher than the reference value, this means that you have too much lead in your body. This may mean that you have been exposed to lead  recently, or that you have lead poisoning. Your health care provider will determine what type of treatment you need based on the amount of lead in your blood and your symptoms. Talk with your health care provider about what your results mean. Questions to ask your health care provider Ask your health care provider, or the department that is doing the test: When will my results be ready? How will I get my results? What are my treatment options? What other tests do I need? What are my next steps? Summary Lead is a substance that occurs naturally in the earth and is very poisonous (toxic) to humans. Exposure to high amounts of lead can result in lead building up in tissues throughout the body, especially in the brain and nervous system. This is called lead poisoning. This test measures the amount of lead in your blood. If your results show a high amount of lead in your body, you may have been exposed to lead recently or have lead poisoning. Your treatment will depend on how severe your condition is. Talk with your health care provider about what your results mean. This information is not intended to replace advice given to you by your health care provider. Make sure you discuss any questions you have with your health care provider. Document Revised: 01/17/2021 Document Reviewed: 01/13/2020 Elsevier Patient Education  2022 [employer].

## 2021-05-14 ENCOUNTER — Encounter: Payer: Self-pay | Admitting: Family Medicine

## 2021-05-14 LAB — COMPREHENSIVE METABOLIC PANEL
ALT: 36 IU/L (ref 0–44)
AST: 20 IU/L (ref 0–40)
Albumin/Globulin Ratio: 2.2 (ref 1.2–2.2)
Albumin: 5.1 g/dL — ABNORMAL HIGH (ref 4.0–5.0)
Alkaline Phosphatase: 89 IU/L (ref 44–121)
BUN/Creatinine Ratio: 14 (ref 9–20)
BUN: 11 mg/dL (ref 6–20)
Bilirubin Total: 0.3 mg/dL (ref 0.0–1.2)
CO2: 24 mmol/L (ref 20–29)
Calcium: 9.5 mg/dL (ref 8.7–10.2)
Chloride: 101 mmol/L (ref 96–106)
Creatinine, Ser: 0.8 mg/dL (ref 0.76–1.27)
Globulin, Total: 2.3 g/dL (ref 1.5–4.5)
Glucose: 87 mg/dL (ref 70–99)
Potassium: 4.5 mmol/L (ref 3.5–5.2)
Sodium: 139 mmol/L (ref 134–144)
Total Protein: 7.4 g/dL (ref 6.0–8.5)
eGFR: 116 mL/min/{1.73_m2} (ref >59)

## 2021-05-15 LAB — RPR W/REFLEX TO TREPSURE: RPR: NONREACTIVE

## 2021-05-15 LAB — T PALLIDUM ANTIBODY, EIA: T pallidum Antibody, EIA: NEGATIVE

## 2021-05-19 ENCOUNTER — Encounter: Payer: Self-pay | Admitting: Family Medicine

## 2021-05-19 DIAGNOSIS — Z77011 Contact with and (suspected) exposure to lead: Secondary | ICD-10-CM | POA: Insufficient documentation

## 2021-05-19 NOTE — Assessment & Plan Note (Signed)
Lead levels elevated 57.1.  Called poison control, who discussed with toxicologist, Dr Cyndia Bent Coty, and recommended to have patient follow up with employer for parameters.  Called [employer] and spoke with Clydie Braun, RN, who recommended to start patient on Calcium and have family tested for lead exposure.  She will follow up with patient and company.  Recomended to follow up with patient in 1 month for repeat lea level. Discussed with patient importance of removal from area of lead exposure at work.  Can apply for workers comp if unable to work in safe area, per [employer]. Encouraged patient to shower immediately after work to avoid transfer to family. Educated that should wait until lead levels below 30 for family planning and likely testosterone levels will return to normal once exposure limited CMet today  Calcium 500 mg TID Follow up with PCP in 1 month

## 2021-05-26 DIAGNOSIS — Z419 Encounter for procedure for purposes other than remedying health state, unspecified: Secondary | ICD-10-CM | POA: Diagnosis not present

## 2021-06-02 ENCOUNTER — Emergency Department (HOSPITAL_COMMUNITY): Payer: Medicaid Other

## 2021-06-02 ENCOUNTER — Emergency Department (HOSPITAL_COMMUNITY)
Admission: EM | Admit: 2021-06-02 | Discharge: 2021-06-03 | Disposition: A | Discharge status: Home / Self Care | Payer: Medicaid Other | Attending: Emergency Medicine | Admitting: Emergency Medicine

## 2021-06-02 ENCOUNTER — Other Ambulatory Visit: Payer: Self-pay

## 2021-06-02 DIAGNOSIS — R519 Headache, unspecified: Secondary | ICD-10-CM | POA: Insufficient documentation

## 2021-06-02 DIAGNOSIS — R202 Paresthesia of skin: Secondary | ICD-10-CM

## 2021-06-02 DIAGNOSIS — R0789 Other chest pain: Secondary | ICD-10-CM | POA: Diagnosis not present

## 2021-06-02 LAB — CBC WITH DIFFERENTIAL/PLATELET
Abs Immature Granulocytes: 0.04 10*3/uL (ref 0.00–0.07)
Basophils Absolute: 0.1 10*3/uL (ref 0.0–0.1)
Basophils Relative: 1 %
Eosinophils Absolute: 0.4 10*3/uL (ref 0.0–0.5)
Eosinophils Relative: 4 %
HCT: 46.1 % (ref 39.0–52.0)
Hemoglobin: 15.7 g/dL (ref 13.0–17.0)
Immature Granulocytes: 1 %
Lymphocytes Relative: 36 %
Lymphs Abs: 3.1 10*3/uL (ref 0.7–4.0)
MCH: 28.8 pg (ref 26.0–34.0)
MCHC: 34.1 g/dL (ref 30.0–36.0)
MCV: 84.6 fL (ref 80.0–100.0)
Monocytes Absolute: 0.6 10*3/uL (ref 0.1–1.0)
Monocytes Relative: 7 %
Neutro Abs: 4.5 10*3/uL (ref 1.7–7.7)
Neutrophils Relative %: 51 %
Platelets: 237 10*3/uL (ref 150–400)
RBC: 5.45 MIL/uL (ref 4.22–5.81)
RDW: 12.9 % (ref 11.5–15.5)
WBC: 8.7 10*3/uL (ref 4.0–10.5)
nRBC: 0 % (ref 0.0–0.2)

## 2021-06-02 LAB — COMPREHENSIVE METABOLIC PANEL
ALT: 33 U/L (ref 0–44)
AST: 22 U/L (ref 15–41)
Albumin: 4.3 g/dL (ref 3.5–5.0)
Alkaline Phosphatase: 71 U/L (ref 38–126)
Anion gap: 7 (ref 5–15)
BUN: 8 mg/dL (ref 6–20)
CO2: 30 mmol/L (ref 22–32)
Calcium: 9.1 mg/dL (ref 8.9–10.3)
Chloride: 103 mmol/L (ref 98–111)
Creatinine, Ser: 0.84 mg/dL (ref 0.61–1.24)
GFR, Estimated: >60 mL/min (ref >60)
Glucose, Bld: 127 mg/dL — ABNORMAL HIGH (ref 70–99)
Potassium: 3.5 mmol/L (ref 3.5–5.1)
Sodium: 140 mmol/L (ref 135–145)
Total Bilirubin: 0.7 mg/dL (ref 0.3–1.2)
Total Protein: 7.2 g/dL (ref 6.5–8.1)

## 2021-06-02 NOTE — ED Triage Notes (Signed)
Pt c/o headache and tingling all over his body.

## 2021-06-02 NOTE — ED Provider Triage Note (Addendum)
Emergency Medicine Provider Triage Evaluation Note  Donald Andrade , a 41 y.o. male  was evaluated in triage.  Pt complains of headache and tingling.  Headache intermittent but tingling has been persistent for about 1 month.  States recently had lead test performed that was elevated at 57.  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  He was switched to new position and started on calcium.  States symptoms worsening today and he is concerned.  He wants his lead level rechecked as he is concerned it is even higher now.  Denies anticoagulant use.  Review of Systems  Positive: Headaches, tingling Negative: fever  Physical Exam  BP (!) 160/99 (BP Location: Right Arm)    Pulse 81    Temp 97.9 F (36.6 C)    Resp 17    Ht 5\' 7"  (1.702 m)    Wt 86.7 kg    SpO2 100%    BMI 29.94 kg/m  Gen:   Awake, no distress   Resp:  Normal effort  MSK:   Moves extremities without difficulty  Other:  Awake, alert, oriented, speech is clear w/interpreter, seemingly normal strength throughout, ambulatory  Medical Decision Making  Medically screening exam initiated at 10:10 PM.  Appropriate orders placed.  Donald Andrade was informed that the remainder of the evaluation will be completed by another provider, this initial triage assessment does not replace that evaluation, and the importance of remaining in the ED until their evaluation is complete.  Headache and tingling x1 months.  Not a code stroke due to prolonged and intermittent symptoms.  Recently diagnosed with high lead levels and switched to new position at his job.  Question if symptoms related to this.  Will check labs, repeat lead level, CT head, EKG.   Ree Kida, PA-C 06/02/21 2222    07/31/21, PA-C 06/02/21 2222

## 2021-06-03 NOTE — Discharge Instructions (Addendum)
Your previous evaluation for possible lead exposure was performed with Dr. Tawanna Cooler McDiarmid and Dr. Dana Allan at the [facility].  They can be reached at [phone].   You had repeat lead levels drawn today.  The lead level testing today will not result for at least another 3-4 days.  It is imperative that you follow-up very closely with the [facility].  Please call today for an appointment with [facility]  Continue to use calcium as previously prescribed.

## 2021-06-03 NOTE — ED Provider Notes (Signed)
[facility]   CSN: [id number] Arrival date & time: 06/02/21  2154     History  Chief Complaint  Patient presents with   Headache   Tingling    Gauge Donald Andrade is a 41 y.o. male.  [id number] year old male with prior medical history as described below presents for evaluation.  Patient with previously documented high lead level.  Last month his blood level in the clinic was 57.  Patient was placed on calcium.  Patient has possible exposure through [place] workplace [place] in [place].  Patient is also a recent immigrant from [place].  Possible sources of his elevated lead level were not entirely clear as of his most recent evaluation.  Patient is presenting today complaining of persistent symptoms related to this exposure.  He complains of continued tingling and paresthesias.  His symptoms have not worsened.  They have not resolved.  He reports that he has been having trouble establishing follow-up in the clinic.  The history is provided by the patient and medical records. A language interpreter was used (Print production plannerArabic translator used for entirety of evaluation).  Illness Location:  Reported elevation in lead level. Severity:  Mild Onset quality:  Gradual Duration:  6 weeks Timing:  Intermittent Progression:  Waxing and waning Chronicity:  Chronic     Home Medications Prior to Admission medications   Medication Sig Start Date End Date Taking? Authorizing Provider  bismuth-metronidazole-tetracycline (PYLERA) [id number] MG capsule Take 3 capsules by mouth 4 (four) times daily -  before meals and at bedtime for 10 days. 01/21/21 02/08/21  Jackelyn PolingWelborn, Ryan, DO  calcium gluconate 500 MG tablet Take 1 tablet (500 mg total) by mouth 3 (three) times daily. 05/13/21   Dana AllanWalsh, Tanya, MD  nicotine (NICODERM CQ - DOSED IN MG/24 HOURS) 21 mg/24hr patch Place 1 patch (21 mg total) onto the skin daily. 01/16/21   Jackelyn PolingWelborn, Ryan, DO  nicotine (NICODERM CQ -  DOSED IN MG/24 HOURS) 21 mg/24hr patch Place 1 patch (21 mg total) onto the skin in the morning. 01/16/21   Latrelle DodrillMcIntyre, Brittany J, MD  nicotine polacrilex (NICORETTE) 4 MG gum Chew 1 piece (4 mg total) by mouth as needed for cravings 01/16/21   Latrelle DodrillMcIntyre, Brittany J, MD  nicotine polacrilex (RA NICOTINE GUM) 4 MG gum Take 1 each (4 mg total) by mouth as needed for smoking cessation. 01/16/21   Jackelyn PolingWelborn, Ryan, DO  omeprazole (PRILOSEC) 20 MG capsule Take 1 capsule (20 mg total) by mouth daily. 01/16/21 03/17/21  Jackelyn PolingWelborn, Ryan, DO  sildenafil (VIAGRA) 50 MG tablet Take 1 tablet (50 mg total) by mouth daily as needed for erectile dysfunction. 05/09/21   Katha CabalBrimage, Vondra, DO      Allergies    Pork-derived products    Review of Systems   Review of Systems  All other systems reviewed and are negative.  Physical Exam Updated Vital Signs BP (!) 127/93 (BP Location: Right Arm)    Pulse 72    Temp 98.1 F (36.7 C)    Resp 14    Ht 5\' 7"  (1.702 m)    Wt 86.7 kg    SpO2 99%    BMI 29.94 kg/m  Physical Exam Vitals and nursing note reviewed.  Constitutional:      General: He is not in acute distress.    Appearance: Normal appearance. He is well-developed.  HENT:     Head: Normocephalic and atraumatic.  Eyes:     General: No visual  field deficit.    Conjunctiva/sclera: Conjunctivae normal.     Pupils: Pupils are equal, round, and reactive to light.  Cardiovascular:     Rate and Rhythm: Normal rate and regular rhythm.     Heart sounds: Normal heart sounds.  Pulmonary:     Effort: Pulmonary effort is normal. No respiratory distress.     Breath sounds: Normal breath sounds.  Abdominal:     General: There is no distension.     Palpations: Abdomen is soft.     Tenderness: There is no abdominal tenderness.  Musculoskeletal:        General: No deformity. Normal range of motion.     Cervical back: Normal range of motion and neck supple.  Skin:    General: Skin is warm and dry.  Neurological:      General: No focal deficit present.     Mental Status: He is alert and oriented to person, place, and time. Mental status is at baseline.     GCS: GCS eye subscore is 4. GCS verbal subscore is 5. GCS motor subscore is 6.     Cranial Nerves: No cranial nerve deficit, dysarthria or facial asymmetry.     Sensory: No sensory deficit.     Motor: No weakness.     Coordination: Romberg sign negative. Coordination normal.    ED Results / Procedures / Treatments   Labs (all labs ordered are listed, but only abnormal results are displayed) Labs Reviewed  COMPREHENSIVE METABOLIC PANEL - Abnormal; Notable for the following components:      Result Value   Glucose, Bld 127 (*)    All other components within normal limits  CBC WITH DIFFERENTIAL/PLATELET  LEAD, BLOOD (ADULT >= 16 YRS)    EKG EKG Interpretation  Date/Time:  Sunday June 02 2021 22:50:46 EST Ventricular Rate:  75 PR Interval:  126 QRS Duration: 80 QT Interval:  362 QTC Calculation: 404 R Axis:   45 Text Interpretation: Normal sinus rhythm Abnormal QRS-T angle, consider primary T wave abnormality Abnormal ECG No previous ECGs available Confirmed by Kristine Royal [phone]) on 06/03/2021 12:30:21 PM  Radiology CT HEAD WO CONTRAST ( )  Result Date: 06/02/2021 CLINICAL DATA:  Headache, new or worsening, neuro deficit (Age 42-49y) headache, tingling, heavy lead exposure EXAM: CT HEAD WITHOUT CONTRAST TECHNIQUE: Contiguous axial images were obtained from the base of the skull through the vertex without intravenous contrast. COMPARISON:  None. FINDINGS: Brain: No intracranial hemorrhage, mass effect, or midline shift. No hydrocephalus. The basilar cisterns are patent. No evidence of territorial infarct or acute ischemia. No extra-axial or intracranial fluid collection. Vascular: No hyperdense vessel or unexpected calcification. Skull: No fracture or focal lesion. Sinuses/Orbits: Mucous retention cyst in left maxillary sinus. Scattered  paranasal sinus mucosal thickening without fluid levels. Orbits are unremarkable. Mastoid air cells are clear. Other: None. IMPRESSION: 1. Negative noncontrast head CT. 2. Mild paranasal sinus disease. Electronically Signed   By: Narda Rutherford M.D.   On: 06/02/2021 22:46    Procedures Procedures    Medications Ordered in ED Medications - No data to display  ED Course/ Medical Decision Making/ A&P                           Medical Decision Making   Medical Screen Complete  This patient presented to the ED with complaint of paresthesias and tingling in the setting of recently documented lead exposure.  This complaint involves an extensive number of  treatment options. The initial differential diagnosis includes, but is not limited to, lead toxicity  This presentation is: Chronic, Complicated, and Systemic Symptoms     Additional history obtained:  External records from outside sources obtained and reviewed including prior ED visits and prior clinic visit and prior Inpatient records.  Previous lead level of 57 documented last month in clinic.  Patient given calcium for treatment of symptoms.  OSHA investigation ongoing for possible work exposure.  Patient is a recent immigrant from [place] -possible exposure outside of the [place] as well.   Lab Tests:  I ordered and personally interpreted labs.  The pertinent results include: CBC and CMP -no acute pathology identified   Imaging Studies ordered:  I ordered imaging studies including CT head I independently visualized and interpreted obtained imaging which showed no acute pathology I agree with the radiologist interpretation.     Consultations Obtained:  I consulted Family Medicine with [facility],  and discussed lab and imaging findings as well as pertinent plan of care. They will follow up in the outpatient setting.  Patient has an appointment for this Wednesday, January 11 at 1:30 PM.   Problem List / ED Course:  Lead  exposure   Reevaluation:  After the interventions noted above, I reevaluated the patient and found that they have: stayed the same   Social Determinants of Health:  Recent immigrant, language barrier   Dispostion:  After consideration of the diagnostic results and the patients response to treatment, I feel that the patent would benefit from close follow-up with the [facility].  He has an appointment scheduled for this Wednesday, January 11 at 1:30 PM.  This was after discussion with resident staff who made this appointment in the outpatient setting so the patient could have close follow-up.  They are aware that lead levels drawn today are pending.         Final Clinical Impression(s) / ED Diagnoses Final diagnoses:  Paresthesia    Rx / DC Orders ED Discharge Orders     None         Wynetta Fines, MD 06/03/21 1328

## 2021-06-03 NOTE — ED Notes (Signed)
Name called for vitals, no response °

## 2021-06-05 ENCOUNTER — Other Ambulatory Visit: Payer: Self-pay

## 2021-06-05 ENCOUNTER — Ambulatory Visit (INDEPENDENT_AMBULATORY_CARE_PROVIDER_SITE_OTHER): Payer: Medicaid Other | Admitting: Family Medicine

## 2021-06-05 VITALS — BP 135/80 | HR 84 | Ht 67.0 in | Wt 196.4 lb

## 2021-06-05 DIAGNOSIS — Z77011 Contact with and (suspected) exposure to lead: Secondary | ICD-10-CM

## 2021-06-05 LAB — LEAD, BLOOD (ADULT >= 16 YRS): Lead-Whole Blood: 37.1 ug/dL — ABNORMAL HIGH (ref 0.0–3.4)

## 2021-06-05 NOTE — Patient Instructions (Addendum)
Thank you for coming to see me today. It was a pleasure.   Your Lead level has improved from 57.1 to 37.1. I'm glad you are improving. Continue to take Calcium 500 mg three times a day  Contact MyChart to register your account to gain access to your labwork  Contact Medicaid to tell them to change your doctor to Dr Jackelyn Poling DO  Please follow-up with Dr Atha Starks in 1 month  If you have any questions or concerns, please do not hesitate to call the office at [phone].  Best,   Dana Allan, MD

## 2021-06-05 NOTE — Progress Notes (Addendum)
° ° °  SUBJECTIVE:   CHIEF COMPLAINT / HPI:  f/u lead results  Due to language barrier, an interpreter was present during the history-taking and subsequent discussion (and for part of the physical exam) with this patient.   Tingling in hands.  Was worse last Sunday but has since improved and now mild.  Went to ED and had repeat lead levels noted to have decreased to 37.1 from 57.1 last month.  CT head negative.  Reports has been removed from high lead area at work.  Compliant with Calcium three times daily.    PERTINENT  PMH / PSH:  None  OBJECTIVE:   BP 135/80    Pulse 84    Ht 5\' 7"  (1.702 m)    Wt 196 lb 6.4 oz (89.1 kg)    SpO2 99%    BMI 30.76 kg/m    General: Alert, no acute distress Cardio: Normal S1 and S2, RRR, no r/m/g Pulm: CTAB, normal work of breathing Abdomen: Bowel sounds normal. Abdomen soft and non-tender.  Extremities: No peripheral edema. Sensation, motor intact. Strength 5/5.    ASSESSMENT/PLAN:   Lead exposure Lead levels improving.  Mild paresthesia although none today and normal neuro exam. -Continue Calcium 500 mg TID -Continue to avoid lead exposure -OSHA has been working with patient to ensure that work environment is compliant with safety  -Follow up with PCP in 4 weeks for repeat lead level     , MD [facility]

## 2021-06-09 ENCOUNTER — Encounter: Payer: Self-pay | Admitting: Family Medicine

## 2021-06-09 NOTE — Assessment & Plan Note (Signed)
Lead levels improving.  Mild paresthesia although none today and normal neuro exam. -Continue Calcium 500 mg TID -Continue to avoid lead exposure -OSHA has been working with patient to ensure that work environment is compliant with safety  -Follow up with PCP in 4 weeks for repeat lead level

## 2021-06-26 DIAGNOSIS — Z419 Encounter for procedure for purposes other than remedying health state, unspecified: Secondary | ICD-10-CM | POA: Diagnosis not present

## 2021-07-01 DIAGNOSIS — Z23 Encounter for immunization: Secondary | ICD-10-CM | POA: Diagnosis not present

## 2021-07-09 ENCOUNTER — Other Ambulatory Visit: Payer: Self-pay

## 2021-07-09 ENCOUNTER — Ambulatory Visit (INDEPENDENT_AMBULATORY_CARE_PROVIDER_SITE_OTHER): Payer: Medicaid Other | Admitting: Family Medicine

## 2021-07-09 ENCOUNTER — Encounter: Payer: Self-pay | Admitting: Family Medicine

## 2021-07-09 VITALS — BP 100/62 | HR 79 | Ht 67.0 in | Wt 198.0 lb

## 2021-07-09 DIAGNOSIS — H9313 Tinnitus, bilateral: Secondary | ICD-10-CM | POA: Diagnosis not present

## 2021-07-09 DIAGNOSIS — H9319 Tinnitus, unspecified ear: Secondary | ICD-10-CM | POA: Insufficient documentation

## 2021-07-09 DIAGNOSIS — Z77011 Contact with and (suspected) exposure to lead: Secondary | ICD-10-CM

## 2021-07-09 NOTE — Progress Notes (Addendum)
° ° °  SUBJECTIVE:   CHIEF COMPLAINT / HPI: [employer] Arabic interpreter used.   Tinnitus  Affected ear: Ringing in his ears started 5 days ago in both ears, currently symptomatic. No pain in his ears. This is a new problem. The problem occurs constantly. The problem has been unchanged (Initially intermittent, but now persistent). There has been no fever. The patient is experiencing no pain. Pertinent negatives include no coughing, ear discharge, headaches, hearing loss or sore throat. Associated symptoms comments: Whenever he touches his earlobe and try to wash when cleaning for prayers, it will feel hot. He endorses occasional dizziness a few days, not related to the ringing in his ear.. He has tried nothing for the symptoms.   He takes Calcium supplements at home with Viagra prn. He recently started Zinc and vitamin B supplements a few days ago.  Lead elevation: Compliant with calcium treatment. He is here for f/u.  PERTINENT  PMH / PSH: PMHx reviewed  OBJECTIVE:   BP 100/62    Pulse 79    Ht 5\' 7"  (1.702 m)    Wt 198 lb (89.8 kg)    SpO2 98%    BMI 31.01 kg/m   Physical Exam Vitals and nursing note reviewed.  HENT:     Head: Normocephalic.     Right Ear: Hearing, ear canal and external ear normal. No drainage or tenderness. There is impacted cerumen.     Left Ear: Hearing, ear canal and external ear normal. No drainage or tenderness. There is impacted cerumen.  Eyes:     Pupils: Pupils are equal, round, and reactive to light.  Cardiovascular:     Rate and Rhythm: Normal rate and regular rhythm.     Pulses: Normal pulses.     Heart sounds: Normal heart sounds. No murmur heard. Pulmonary:     Effort: Pulmonary effort is normal. No respiratory distress.     Breath sounds: Normal breath sounds. No wheezing.  Abdominal:     General: There is no distension.     ASSESSMENT/PLAN:   Tinnitus Benign ENT exam. Negative red flag symptoms such as unilaterality, pulsatile, vertigo, or  hearing loss. Also, no ear pain. He passed his hearing screening. + Cerumen impaction B/L may be contributing to his symptoms. His current home medicine will not cause Tinnitus, although Viagra may cause hearing loss in the long term. I offered ear lavage today, but he declined. Instruction was provided to obtain Debrox OTC. F/U in 1 week for reassessment or sooner if symptoms worsen. ENT evaluation discussed. However, he will trial Debrox first and consider referral if there is no improvement. F/U appointment made with his PCP. He is aware of the date and time.  Lead exposure Previous record including ED evaluation report reviewed. Lead level and anemia panel B ordered. I will contact him soon with the result. He agreed with the plan.    Andrena Mews, MD [place]

## 2021-07-09 NOTE — Patient Instructions (Signed)
Tinnitus Tinnitus refers to hearing a sound when there is no actual source for that sound. This is often described as ringing in the ears. However, people with this condition may hear a variety of noises, in one ear or in both ears. The sounds of tinnitus can be soft, loud, or somewhere in between. Tinnitus can last for a few seconds or can be constant for days. It may go away without treatment and come back at various times. When tinnitus is constant or happens often, it can lead to other problems, such as trouble sleeping and trouble concentrating. Almost everyone experiences tinnitus at some point. Tinnitus is not the same as hearing loss. Tinnitus that is long-lasting (chronic) or comes back often (recurs) may require medical attention. What are the causes? The cause of tinnitus is often not known. In some cases, it can result from: Exposure to loud noises from machinery, music, or other sources. An object (foreign body) stuck in the ear. Earwax buildup. Drinking alcohol or caffeine. Taking certain medicines. Age-related hearing loss. It may also be caused by medical conditions such as: Ear or sinus infections. Heart diseases or high blood pressure. Allergies. Mnire's disease. Thyroid problems. Tumors. A weak, bulging blood vessel (aneurysm) near the ear. What increases the risk? The following factors may make you more likely to develop this condition: Exposure to loud noises. Age. Tinnitus is more likely in older individuals. Using alcohol or tobacco. What are the signs or symptoms? The main symptom of tinnitus is hearing a sound when there is no source for that sound. It may sound like: Buzzing. Sizzling. Ringing. Blowing air. Hissing. Whistling. Other sounds may include: Roaring. Running water. A musical note. Tapping. Humming. Symptoms may affect only one ear (unilateral) or both ears (bilateral). How is this diagnosed? Tinnitus is diagnosed based on your symptoms,  your medical history, and a physical exam. Your health care provider may do a thorough hearing test (audiologic exam) if your tinnitus: Is unilateral. Causes hearing difficulties. Lasts 6 months or longer. You may work with a health care provider who specializes in hearing disorders (audiologist). You may be asked questions about your symptoms and how they affect your daily life. You may have other tests done, such as: CT scan. MRI. An imaging test of how blood flows through your blood vessels (angiogram). How is this treated? Treating an underlying medical condition can sometimes make tinnitus go away. If your tinnitus continues, other treatments may include: Therapy and counseling to help you manage the stress of living with tinnitus. Sound generators to mask the tinnitus. These include: Tabletop sound machines that play relaxing sounds to help you fall asleep. Wearable devices that fit in your ear and play sounds or music. Acoustic neural stimulation. This involves using headphones to listen to music that contains an auditory signal. Over time, listening to this signal may change some pathways in your brain and make you less sensitive to tinnitus. This treatment is used for very severe cases when no other treatment is working. Using hearing aids or cochlear implants if your tinnitus is related to hearing loss. Hearing aids are worn in the outer ear. Cochlear implants are surgically placed in the inner ear. Follow these instructions at home: Managing symptoms   When possible, avoid being in loud places and being exposed to loud sounds. Wear hearing protection, such as earplugs, when you are exposed to loud noises. Use a white noise machine, a humidifier, or other devices to mask the sound of tinnitus. Practice techniques   for reducing stress, such as meditation, yoga, or deep breathing. Work with your health care provider if you need help with managing stress. Sleep with your head slightly  raised. This may reduce the impact of tinnitus. General instructions Do not use stimulants, such as nicotine, alcohol, or caffeine. Talk with your health care provider about other stimulants to avoid. Stimulants are substances that can make you feel alert and attentive by increasing certain activities in the body (such as heart rate and blood pressure). These substances may make tinnitus worse. Take over-the-counter and prescription medicines only as told by your health care provider. Try to get plenty of sleep each night. Keep all follow-up visits. This is important. Contact a health care provider if: Your tinnitus continues for 3 weeks or longer without stopping. You develop sudden hearing loss. Your symptoms get worse or do not get better with home care. You feel you are not able to manage the stress of living with tinnitus. Get help right away if: You develop tinnitus after a head injury. You have tinnitus along with any of the following: Dizziness. Nausea and vomiting. Loss of balance. Sudden, severe headache. Vision changes. Facial weakness or weakness of arms or legs. These symptoms may represent a serious problem that is an emergency. Do not wait to see if the symptoms will go away. Get medical help right away. Call your local emergency services (911 in the [place] Do not drive yourself to the hospital. Summary Tinnitus refers to hearing a sound when there is no actual source for that sound. This is often described as ringing in the ears. Symptoms may affect only one ear (unilateral) or both ears (bilateral). Use a white noise machine, a humidifier, or other devices to mask the sound of tinnitus. Do not use stimulants, such as nicotine, alcohol, or caffeine. These substances may make tinnitus worse. This information is not intended to replace advice given to you by your health care provider. Make sure you discuss any questions you have with your health care provider. Document  Revised: 04/16/2020 Document Reviewed: 04/16/2020 Elsevier Patient Education  2022 [employer] Inc.  

## 2021-07-09 NOTE — Assessment & Plan Note (Addendum)
Benign ENT exam. Negative red flag symptoms such as unilaterality, pulsatile, vertigo, or hearing loss. Also, no ear pain. He passed his hearing screening. + Cerumen impaction B/L may be contributing to his symptoms. His current home medicine will not cause Tinnitus, although Viagra may cause hearing loss in the long term. I offered ear lavage today, but he declined. Instruction was provided to obtain Debrox OTC. F/U in 1 week for reassessment or sooner if symptoms worsen. ENT evaluation discussed. However, he will trial Debrox first and consider referral if there is no improvement. F/U appointment made with his PCP. He is aware of the date and time.

## 2021-07-09 NOTE — Assessment & Plan Note (Addendum)
Previous record including ED evaluation report reviewed. Lead level and anemia panel B ordered. I will contact him soon with the result. He agreed with the plan.

## 2021-07-11 ENCOUNTER — Telehealth: Payer: Self-pay | Admitting: Family Medicine

## 2021-07-11 LAB — ANEMIA PROFILE B
Basophils Absolute: 0.1 10*3/uL (ref 0.0–0.2)
Basos: 1 %
EOS (ABSOLUTE): 0.3 10*3/uL (ref 0.0–0.4)
Eos: 5 %
Ferritin: 148 ng/mL (ref 30–400)
Folate: 15.7 ng/mL (ref >3.0)
Hematocrit: 43.3 % (ref 37.5–51.0)
Hemoglobin: 14.9 g/dL (ref 13.0–17.7)
Immature Grans (Abs): 0 10*3/uL (ref 0.0–0.1)
Immature Granulocytes: 1 %
Iron Saturation: 25 % (ref 15–55)
Iron: 99 ug/dL (ref 38–169)
Lymphocytes Absolute: 2.2 10*3/uL (ref 0.7–3.1)
Lymphs: 33 %
MCH: 29.3 pg (ref 26.6–33.0)
MCHC: 34.4 g/dL (ref 31.5–35.7)
MCV: 85 fL (ref 79–97)
Monocytes Absolute: 0.5 10*3/uL (ref 0.1–0.9)
Monocytes: 7 %
Neutrophils Absolute: 3.5 10*3/uL (ref 1.4–7.0)
Neutrophils: 53 %
Platelets: 240 10*3/uL (ref 150–450)
RBC: 5.09 x10E6/uL (ref 4.14–5.80)
RDW: 13 % (ref 11.6–15.4)
Retic Ct Pct: 1.4 % (ref 0.6–2.6)
Total Iron Binding Capacity: 389 ug/dL (ref 250–450)
UIBC: 290 ug/dL (ref 111–343)
Vitamin B-12: 322 pg/mL (ref 232–1245)
WBC: 6.6 10*3/uL (ref 3.4–10.8)

## 2021-07-11 LAB — LEAD, BLOOD (ADULT >= 16 YRS): Lead-Whole Blood: 25.3 ug/dL — ABNORMAL HIGH (ref 0.0–3.4)

## 2021-07-11 NOTE — Telephone Encounter (Signed)
error 

## 2021-07-11 NOTE — Telephone Encounter (Addendum)
Arabic interpreter used [employer] interpreter ID: [id number]  Test result discussed. Lead improved. Continue calcium therapy. Repeat test in 2-4 weeks.  She asked about Vit D and zinc supplements he is taking OTC which he is using for ED. I advised these meds might not cause harm. However, there is no lab suggesting low Vit D. He is reminded he has a PCP appointment next week to discuss his ED and supplements. He verbalized understanding.  Tinnitus is worsening with very mild dizziness.  I discussed referral to ENT now. He declined. He prefers to see his PCP next week for this. F/U with PCP as planned.

## 2021-07-15 NOTE — Progress Notes (Signed)
SUBJECTIVE:   CHIEF COMPLAINT / HPI:   Tinnitus: Previously seen on 07/09/2021 for tinnitus of both ears.  At that time had a benign ENT exam and positive cerumen impaction bilaterally that was thought to be the cause of his symptoms.  He was offered an ear lavage but declined it.  Was ultimately recommended Debrox over-the-counter and discussed red flag symptoms.  Was noted to have exposure to lead at work with previous lead of 57>37>25 with most recent checked 2/14. Today he states he feels as if his ears are clogged. He used the debrox after last appointment twice per day but it hasn't helped. Says it has been going on for 2 weeks. Denies pain.  Erectile dysfunction He previously tried sildenafil 50mg  but states he got no benefit from it at 100mg  daily. He has not tried other medications.  He states he continues to smoke cigarettes.  He states that he has desire for intercourse during his sleep such as during dreams but does not have this during the day.  He states this has been going on for about 6 months and he has never had an issue before that.     Arabic interpretor used for patient encounter  PERTINENT  PMH / PSH: Current smoker  OBJECTIVE:   BP 110/60    Pulse 71    Ht 5\' 7"  (1.702 m)    Wt 198 lb (89.8 kg)    BMI 31.01 kg/m    General: NAD, pleasant, able to participate in exam HEENT: Impacted cerumen bilaterally.  On reevaluation the cerumen has been removed and the tympanic membranes are normal bilaterally. Respiratory: No respiratory distress Neuro: alert, no obvious focal deficits Psych: Normal affect and mood  ASSESSMENT/PLAN:   No problem-specific Assessment & Plan notes found for this encounter.   Tinnitus: Has been going on for at least 2 weeks.  This is in the setting of impacted cerumen and an elevated lead level due to an exposure at work.  His previous lead is been 57 > 37 > 25 with most recent checked on 2/14.  Patient does have impacted cerumen bilaterally  and tried Debrox in the outpatient setting.  Ear lavage was performed today with recheck showing clear tympanic membranes bilaterally after cerumen was removed from the auditory canal.  He endorsed that the ear ringing had improved substantially in bilateral ears after the cerumen was removed.  Discussed return precautions and if the tinnitus resolves we do not have to follow-up any further but if it returns or worsens we should consider ENT referral due to his recent elevated lead level and the possibility that this causing cochlear damage.  Erectile dysfunction: 41 year old male with erectile dysfunction.  He states he previously worked up to 100 mg of sildenafil and got no benefit from it so he stopped using it.  He continues to be a current smoker.  He is also being seen for tinnitus in the setting of impacted cerumen as well as recent elevated lead levels.  Discussed initiating Cialis, it does have an adverse effect of tinnitus less than 2% documented via up-to-date.  Discussed cutting back on smoking and quitting smoking as an additional option that should improve erections.  Discussed this with attending Dr. and will give this a trial.  If it is not covered, if he cannot afford it, or if this does not work can get him referred for urology.  Upon given the patient his AVS he decided he was not interested  in additional medication and requested referral for urology.  I have placed this referral.  I discussed with him that we can cancel the Cialis prescription.  Jackelyn Poling, DO [facility]

## 2021-07-17 ENCOUNTER — Encounter: Payer: Self-pay | Admitting: Family Medicine

## 2021-07-17 ENCOUNTER — Ambulatory Visit (INDEPENDENT_AMBULATORY_CARE_PROVIDER_SITE_OTHER): Payer: Medicaid Other | Admitting: Family Medicine

## 2021-07-17 ENCOUNTER — Other Ambulatory Visit: Payer: Self-pay

## 2021-07-17 VITALS — BP 110/60 | HR 71 | Ht 67.0 in | Wt 198.0 lb

## 2021-07-17 DIAGNOSIS — N529 Male erectile dysfunction, unspecified: Secondary | ICD-10-CM

## 2021-07-17 DIAGNOSIS — H9313 Tinnitus, bilateral: Secondary | ICD-10-CM

## 2021-07-17 MED ORDER — TADALAFIL 20 MG PO TABS: 10.0000 mg | ORAL_TABLET | ORAL | 11 refills | Status: DC | PRN

## 2021-07-17 NOTE — Patient Instructions (Addendum)
I am prescribing a medication for you to take for erectile dysfunction.  It is called Cialis.  Your insurance does not cover it or if it is too expensive I can send you for a urology referral and they can discuss other options for erections.  I do think cutting back on smoking will help you significantly with this.  For your ear ringing if you experience any further symptoms with this please let us know.  I think the ear wax was the primary cause of this but sometimes high lead levels can cause it so please follow-up with [place] if it returns or does not continue to be improved.

## 2021-07-24 DIAGNOSIS — Z419 Encounter for procedure for purposes other than remedying health state, unspecified: Secondary | ICD-10-CM | POA: Diagnosis not present

## 2021-07-30 NOTE — Progress Notes (Deleted)
? ? ?  SUBJECTIVE:  ? ?CHIEF COMPLAINT / HPI:  ? ?Encounter for lead testing: ?41 year old male with history including a lead exposure at work with recent lead levels of 57 > 37 > 25.  He presents today for repeat lead testing. ? ?PERTINENT  PMH / PSH: *** ? ?OBJECTIVE:  ? ?There were no vitals taken for this visit. *** ? ?General: NAD, pleasant, able to participate in exam ?Cardiac: RRR, no murmurs. ?Respiratory: CTAB, normal effort, No wheezes, rales or rhonchi ?Abdomen: Bowel sounds present, nontender, nondistended, no hepatosplenomegaly. ?Extremities: no edema or cyanosis. ?Skin: warm and dry, no rashes noted ?Neuro: alert, no obvious focal deficits ?Psych: Normal affect and mood ? ?ASSESSMENT/PLAN:  ? ?No problem-specific Assessment & Plan notes found for this encounter. ?  ? ? ?Jackelyn Poling, DO ?[facility]  ? ? ?{    This will disappear when note is signed, click to select method of visit    :1} ?

## 2021-07-31 ENCOUNTER — Ambulatory Visit: Payer: Medicaid Other | Admitting: Family Medicine

## 2021-08-12 NOTE — Progress Notes (Signed)
Cancelled appointment and changed to lab only visit.  ?

## 2021-08-13 ENCOUNTER — Other Ambulatory Visit: Payer: Medicaid Other

## 2021-08-13 ENCOUNTER — Other Ambulatory Visit: Payer: Self-pay | Admitting: Family Medicine

## 2021-08-13 ENCOUNTER — Ambulatory Visit (INDEPENDENT_AMBULATORY_CARE_PROVIDER_SITE_OTHER): Payer: Medicaid Other | Admitting: Family Medicine

## 2021-08-13 ENCOUNTER — Other Ambulatory Visit: Payer: Self-pay

## 2021-08-13 DIAGNOSIS — Z77011 Contact with and (suspected) exposure to lead: Secondary | ICD-10-CM | POA: Diagnosis not present

## 2021-08-16 LAB — LEAD, BLOOD (ADULT >= 16 YRS): Lead-Whole Blood: 20.9 ug/dL — ABNORMAL HIGH (ref 0.0–3.4)

## 2021-08-24 DIAGNOSIS — Z419 Encounter for procedure for purposes other than remedying health state, unspecified: Secondary | ICD-10-CM | POA: Diagnosis not present

## 2021-08-27 ENCOUNTER — Telehealth: Payer: Self-pay

## 2021-08-27 NOTE — Telephone Encounter (Signed)
Patient calls nurse line requesting an arabic interpreter.  ? ?Interpreter used to call patient back, however no answer or option for VM.  ?

## 2021-09-23 DIAGNOSIS — Z419 Encounter for procedure for purposes other than remedying health state, unspecified: Secondary | ICD-10-CM | POA: Diagnosis not present

## 2021-10-14 DIAGNOSIS — N529 Male erectile dysfunction, unspecified: Secondary | ICD-10-CM | POA: Diagnosis not present

## 2021-10-24 DIAGNOSIS — Z419 Encounter for procedure for purposes other than remedying health state, unspecified: Secondary | ICD-10-CM | POA: Diagnosis not present

## 2021-11-23 DIAGNOSIS — Z419 Encounter for procedure for purposes other than remedying health state, unspecified: Secondary | ICD-10-CM | POA: Diagnosis not present

## 2021-12-24 DIAGNOSIS — Z419 Encounter for procedure for purposes other than remedying health state, unspecified: Secondary | ICD-10-CM | POA: Diagnosis not present

## 2022-01-24 DIAGNOSIS — Z419 Encounter for procedure for purposes other than remedying health state, unspecified: Secondary | ICD-10-CM | POA: Diagnosis not present

## 2022-02-23 DIAGNOSIS — Z419 Encounter for procedure for purposes other than remedying health state, unspecified: Secondary | ICD-10-CM | POA: Diagnosis not present

## 2022-03-26 DIAGNOSIS — Z419 Encounter for procedure for purposes other than remedying health state, unspecified: Secondary | ICD-10-CM | POA: Diagnosis not present

## 2022-04-25 DIAGNOSIS — Z419 Encounter for procedure for purposes other than remedying health state, unspecified: Secondary | ICD-10-CM | POA: Diagnosis not present

## 2022-05-26 DIAGNOSIS — Z419 Encounter for procedure for purposes other than remedying health state, unspecified: Secondary | ICD-10-CM | POA: Diagnosis not present

## 2022-06-26 DIAGNOSIS — Z419 Encounter for procedure for purposes other than remedying health state, unspecified: Secondary | ICD-10-CM | POA: Diagnosis not present

## 2022-07-25 DIAGNOSIS — Z419 Encounter for procedure for purposes other than remedying health state, unspecified: Secondary | ICD-10-CM | POA: Diagnosis not present

## 2022-08-25 DIAGNOSIS — Z419 Encounter for procedure for purposes other than remedying health state, unspecified: Secondary | ICD-10-CM | POA: Diagnosis not present

## 2022-09-24 DIAGNOSIS — Z419 Encounter for procedure for purposes other than remedying health state, unspecified: Secondary | ICD-10-CM | POA: Diagnosis not present

## 2022-10-25 DIAGNOSIS — Z419 Encounter for procedure for purposes other than remedying health state, unspecified: Secondary | ICD-10-CM | POA: Diagnosis not present

## 2022-11-24 DIAGNOSIS — Z419 Encounter for procedure for purposes other than remedying health state, unspecified: Secondary | ICD-10-CM | POA: Diagnosis not present

## 2022-12-25 DIAGNOSIS — Z419 Encounter for procedure for purposes other than remedying health state, unspecified: Secondary | ICD-10-CM | POA: Diagnosis not present

## 2023-01-25 DIAGNOSIS — Z419 Encounter for procedure for purposes other than remedying health state, unspecified: Secondary | ICD-10-CM | POA: Diagnosis not present

## 2023-02-24 DIAGNOSIS — Z419 Encounter for procedure for purposes other than remedying health state, unspecified: Secondary | ICD-10-CM | POA: Diagnosis not present

## 2023-03-27 DIAGNOSIS — Z419 Encounter for procedure for purposes other than remedying health state, unspecified: Secondary | ICD-10-CM | POA: Diagnosis not present

## 2023-04-26 DIAGNOSIS — Z419 Encounter for procedure for purposes other than remedying health state, unspecified: Secondary | ICD-10-CM | POA: Diagnosis not present

## 2023-05-27 DIAGNOSIS — Z419 Encounter for procedure for purposes other than remedying health state, unspecified: Secondary | ICD-10-CM | POA: Diagnosis not present

## 2023-06-27 DIAGNOSIS — Z419 Encounter for procedure for purposes other than remedying health state, unspecified: Secondary | ICD-10-CM | POA: Diagnosis not present

## 2023-06-30 IMAGING — CT CT HEAD W/O CM
4 series · 17 of 47 positions shown, 19 images · non-contrast
Comparison: None.

CLINICAL DATA: Headache, new or worsening, neuro deficit (Age
18-49y) headache, tingling, heavy lead exposure

EXAM:
CT HEAD WITHOUT CONTRAST
TECHNIQUE: Contiguous axial images were obtained from the base of the skull
through the vertex without intravenous contrast.

[Series 3: head without · axial · non-contrast · 0.43mm/px · z∈[+324,+444]mm · 6 of 34 slices shown, 8 images]
[im 5/34  brain]
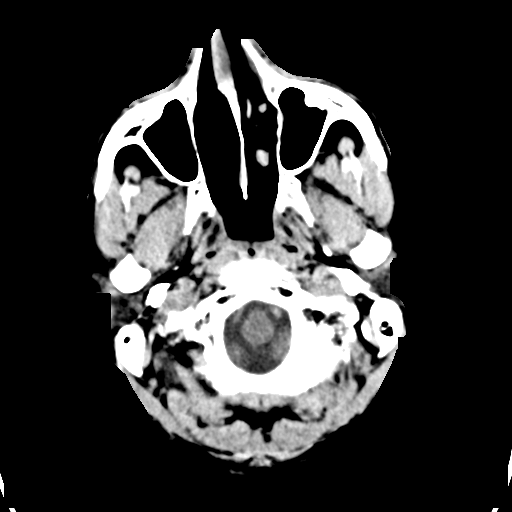
[im 5/34  bone]
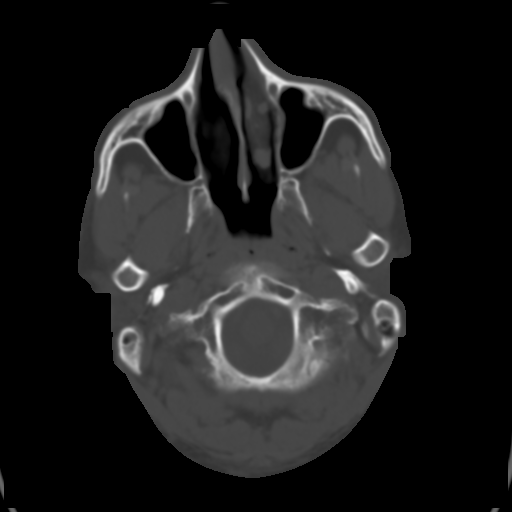
[im 10/34  brain]
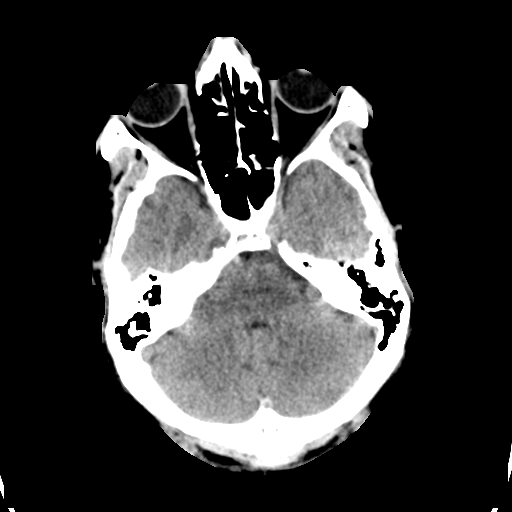
[im 15/34  brain]
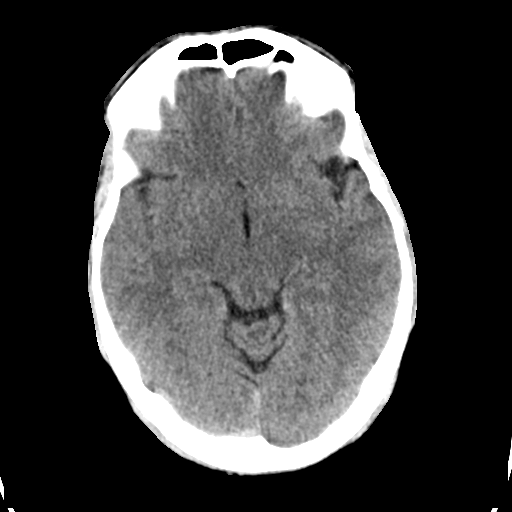
[im 19/34  brain]
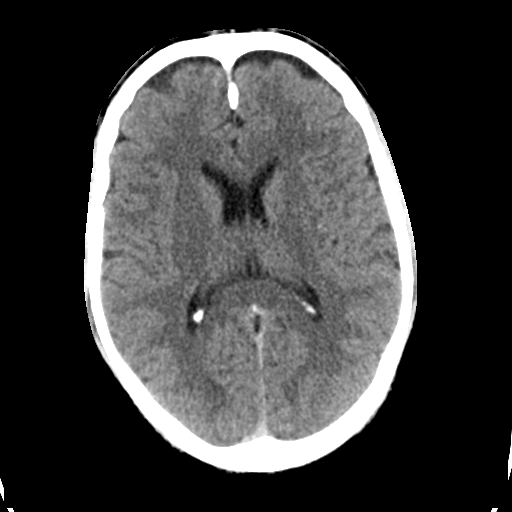
[im 24/34  brain]
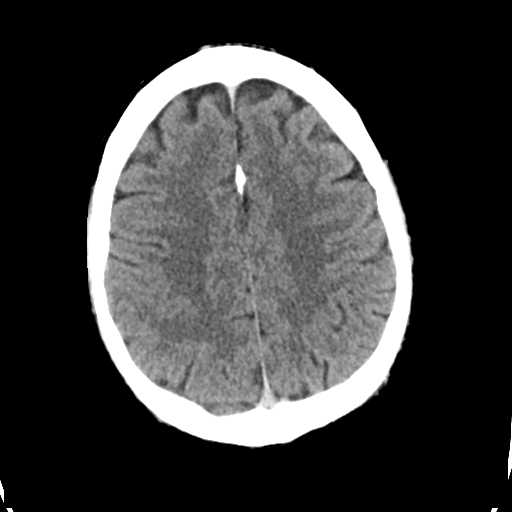
[im 24/34  bone]
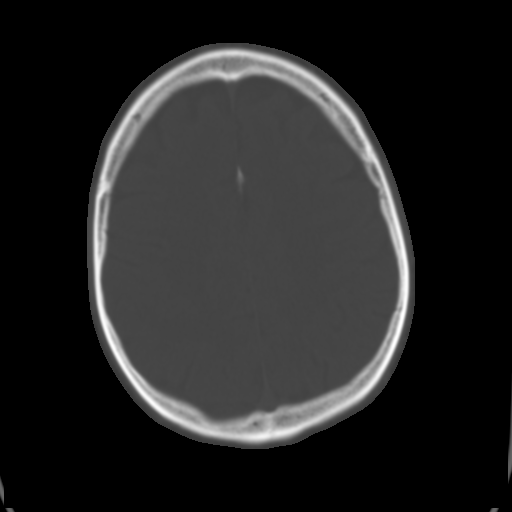
[im 29/34  brain]
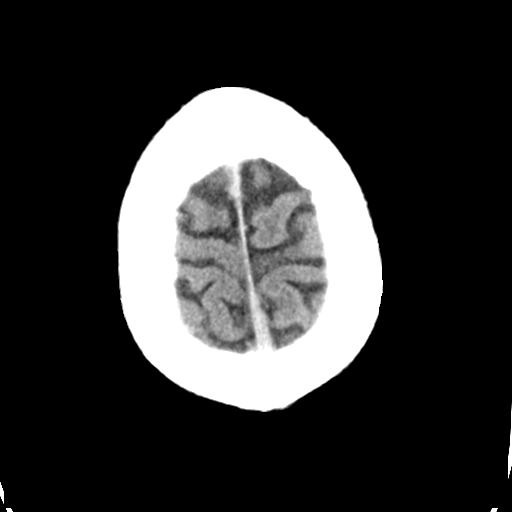

[Series 4: head bone · axial · 0.43mm/px · z∈[+320,+402]mm · 5 of 87 slices shown]
[im 9/87  bone]
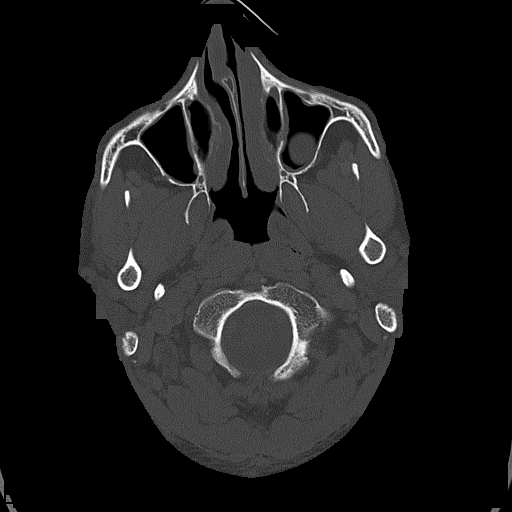
[im 17/87  bone]
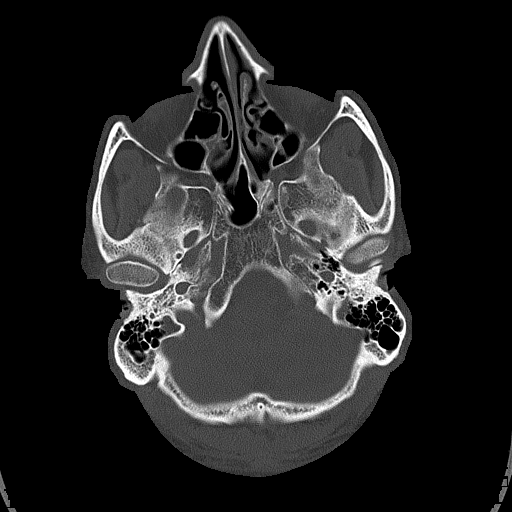
[im 29/87  bone]
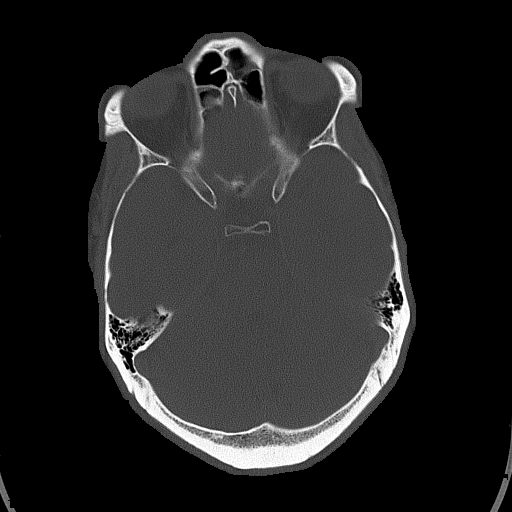
[im 37/87  bone]
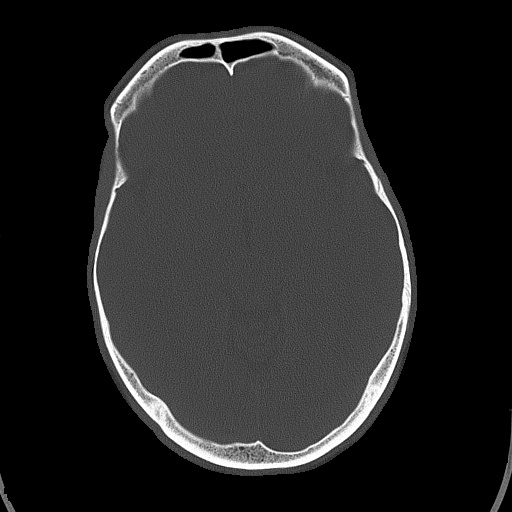
[im 50/87  bone]
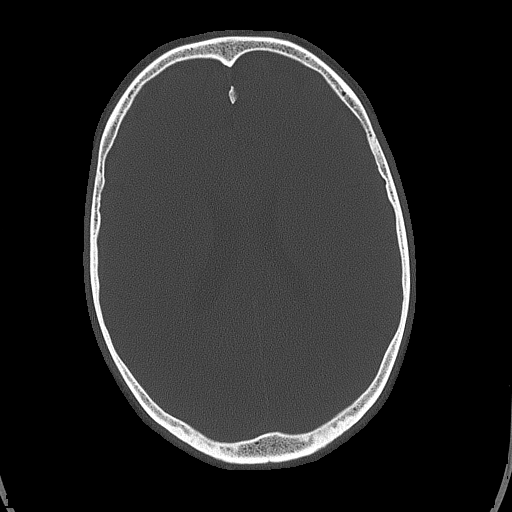

[Series 5: head without cor · coronal · non-contrast · 0.35mm/px · 3 of 71 slices shown]
[im 24/71  brain]
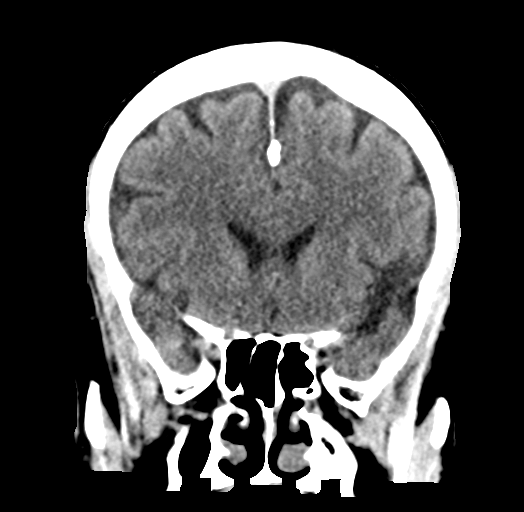
[im 32/71  brain]
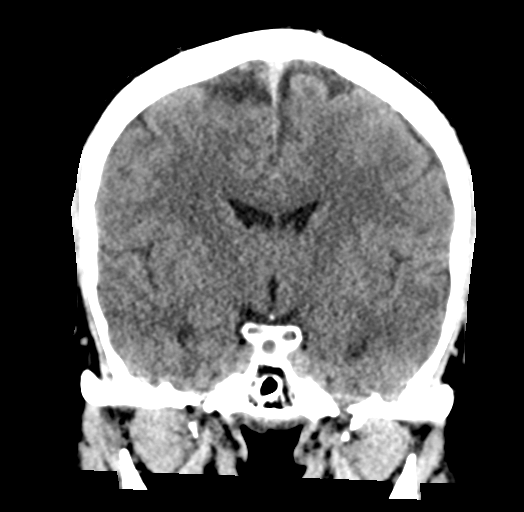
[im 39/71  brain]
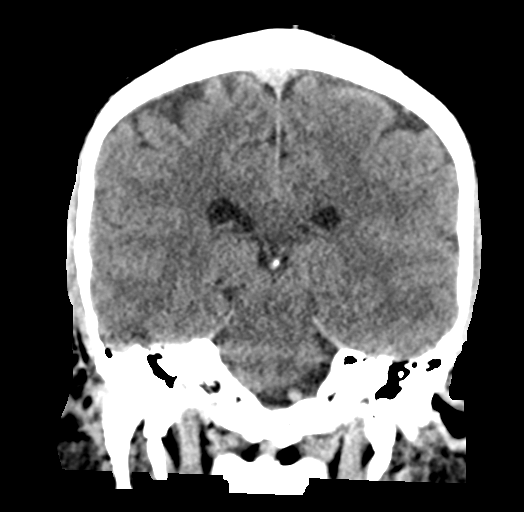

[Series 6: head without sag · sagittal · non-contrast · 0.35mm/px · 3 of 57 slices shown]
[im 19/57  brain]
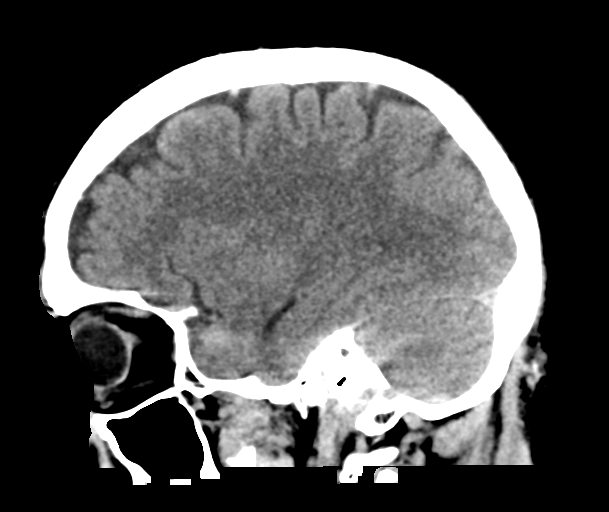
[im 29/57  brain]
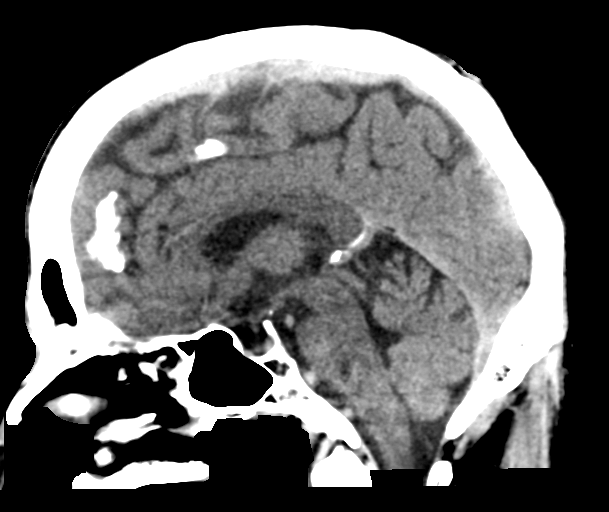
[im 38/57  brain]
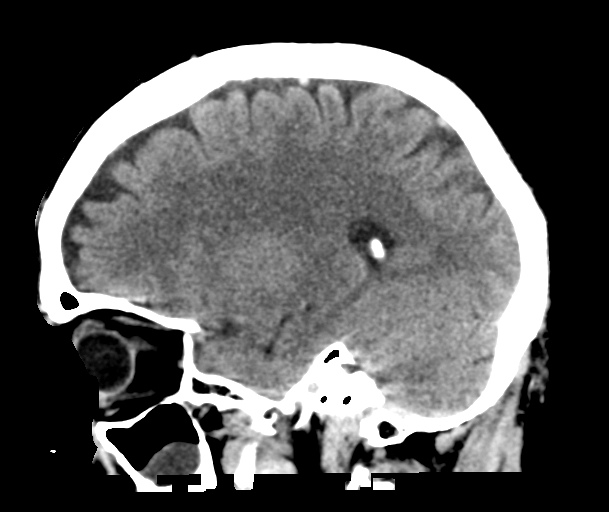

[17 of 47 positions shown; findings below may reference images not displayed]

FINDINGS: Brain: No intracranial hemorrhage, mass effect, or midline shift. No
hydrocephalus. The basilar cisterns are patent. No evidence of
territorial infarct or acute ischemia. No extra-axial or
intracranial fluid collection.

Vascular: No hyperdense vessel or unexpected calcification.

Skull: No fracture or focal lesion.

Sinuses/Orbits: Mucous retention cyst in left maxillary sinus.
Scattered paranasal sinus mucosal thickening without fluid levels.
Orbits are unremarkable. Mastoid air cells are clear.

Other: None.
IMPRESSION: 1. Negative noncontrast head CT.
2. Mild paranasal sinus disease.

## 2023-07-25 DIAGNOSIS — Z419 Encounter for procedure for purposes other than remedying health state, unspecified: Secondary | ICD-10-CM | POA: Diagnosis not present

## 2023-09-05 DIAGNOSIS — Z419 Encounter for procedure for purposes other than remedying health state, unspecified: Secondary | ICD-10-CM | POA: Diagnosis not present

## 2023-10-05 DIAGNOSIS — Z419 Encounter for procedure for purposes other than remedying health state, unspecified: Secondary | ICD-10-CM | POA: Diagnosis not present

## 2023-11-05 DIAGNOSIS — Z419 Encounter for procedure for purposes other than remedying health state, unspecified: Secondary | ICD-10-CM | POA: Diagnosis not present

## 2023-12-05 DIAGNOSIS — Z419 Encounter for procedure for purposes other than remedying health state, unspecified: Secondary | ICD-10-CM | POA: Diagnosis not present

## 2024-01-05 DIAGNOSIS — Z419 Encounter for procedure for purposes other than remedying health state, unspecified: Secondary | ICD-10-CM | POA: Diagnosis not present

## 2024-02-05 DIAGNOSIS — Z419 Encounter for procedure for purposes other than remedying health state, unspecified: Secondary | ICD-10-CM | POA: Diagnosis not present

## 2024-04-06 DIAGNOSIS — Z419 Encounter for procedure for purposes other than remedying health state, unspecified: Secondary | ICD-10-CM | POA: Diagnosis not present

## 2024-05-02 ENCOUNTER — Emergency Department (HOSPITAL_BASED_OUTPATIENT_CLINIC_OR_DEPARTMENT_OTHER)
Admission: EM | Admit: 2024-05-02 | Discharge: 2024-05-02 | Disposition: A | Discharge status: Home / Self Care | Attending: Emergency Medicine | Admitting: Emergency Medicine

## 2024-05-02 ENCOUNTER — Encounter (HOSPITAL_BASED_OUTPATIENT_CLINIC_OR_DEPARTMENT_OTHER): Payer: Self-pay | Admitting: Emergency Medicine

## 2024-05-02 ENCOUNTER — Emergency Department (HOSPITAL_BASED_OUTPATIENT_CLINIC_OR_DEPARTMENT_OTHER)

## 2024-05-02 ENCOUNTER — Other Ambulatory Visit: Payer: Self-pay

## 2024-05-02 DIAGNOSIS — I10 Essential (primary) hypertension: Secondary | ICD-10-CM | POA: Diagnosis not present

## 2024-05-02 DIAGNOSIS — R030 Elevated blood-pressure reading, without diagnosis of hypertension: Secondary | ICD-10-CM | POA: Insufficient documentation

## 2024-05-02 DIAGNOSIS — M549 Dorsalgia, unspecified: Secondary | ICD-10-CM

## 2024-05-02 DIAGNOSIS — M546 Pain in thoracic spine: Secondary | ICD-10-CM | POA: Diagnosis not present

## 2024-05-02 MED ORDER — IBUPROFEN 400 MG PO TABS
400.0000 mg | ORAL_TABLET | Freq: Once | ORAL | Status: AC
  Administered 2024-05-02: 400 mg via ORAL
  Filled 2024-05-02: qty 1

## 2024-05-02 MED ORDER — METHOCARBAMOL 750 MG PO TABS: 750.0000 mg | ORAL_TABLET | Freq: Three times a day (TID) | ORAL | 0 refills | Status: AC | PRN

## 2024-05-02 MED ORDER — ACETAMINOPHEN 500 MG PO TABS
1000.0000 mg | ORAL_TABLET | Freq: Once | ORAL | Status: AC
  Administered 2024-05-02: 1000 mg via ORAL
  Filled 2024-05-02: qty 2

## 2024-05-02 NOTE — Discharge Instructions (Addendum)
 It was our pleasure to provide your ER care today - we hope that you feel better.  Your xrays look good.   Take acetaminophen  or ibuprofen  as need for pain. You may also take robaxin  as need for muscle pain/spasm - no driving when taking.  You may also try massage of area.   Follow up closely with primary care doctor in the next 1-2 weeks for recheck.  Also have your blood pressure rechecked then as it is mildly high today.   Return to ER right away if worse, new symptoms, new/severe pain, chest pain, trouble breathing, severe abdominal pain, or other emergency concern.

## 2024-05-02 NOTE — ED Provider Notes (Signed)
 [facility] Provider Note   CSN: [id number] Arrival date & time: 05/02/24  9141     Patient presents with: Back Pain   Donald Andrade is a 43 y.o. male.   Pt with hx pain to upper back, scapular/between scapula area in past couple weeks. Worse w certain movements of shoulders and upper back. Non radicular. No associated numbness or weakness. 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 No 'anterior' pain - no chest pain or discomfort of any sort. No sob or unusual doe. No abd pain or discomfort (although indicates occasionally spicy foods bother him).  Remote hx cholecystectomy. Normal appetite. No nv. No fever or chills. No cough or uri symptoms. No neck pain.   The history is provided by the patient and medical records. A language interpreter was used (AV interpreter used).  Back Pain Associated symptoms: no abdominal pain, no chest pain, no fever, no headaches, no numbness and no weakness        Prior to Admission medications   Medication Sig Start Date End Date Taking? Authorizing Provider  bismuth -metronidazole -tetracycline  (PYLERA ) 140-125-125 MG capsule Take 3 capsules by mouth 4 (four) times daily -  before meals and at bedtime for 10 days. 01/21/21 02/08/21  Dayna Motto, DO  calcium  gluconate 500 MG tablet Take 1 tablet (500 mg total) by mouth 3 (three) times daily. 05/13/21   Hope Merle, MD  nicotine  (NICODERM CQ  - DOSED IN MG/24 HOURS) 21 mg/24hr patch Place 1 patch (21 mg total) onto the skin daily. Patient not taking: Reported on 07/09/2021 01/16/21   Dayna Motto, DO  nicotine  (NICODERM CQ  - DOSED IN MG/24 HOURS) 21 mg/24hr patch Place 1 patch (21 mg total) onto the skin in the morning. Patient not taking: Reported on 07/09/2021 01/16/21   Donah Laymon PARAS, MD  nicotine  polacrilex (NICORETTE ) 4 MG gum Chew 1 piece (4 mg total) by mouth  as needed for cravings Patient not taking: Reported on 07/09/2021 01/16/21   Donah Laymon PARAS, MD  nicotine  polacrilex (RA NICOTINE  GUM) 4 MG gum Take 1 each (4 mg total) by mouth as needed for smoking cessation. Patient not taking: Reported on 07/09/2021 01/16/21   Dayna Motto, DO  omeprazole  (PRILOSEC) 20 MG capsule Take 1 capsule (20 mg total) by mouth daily. 01/16/21 03/17/21  Dayna Motto, DO    Allergies: Porcine (pork) protein-containing drug products    Review of Systems  Constitutional:  Negative for fever.  HENT:  Negative for sore throat.   Respiratory:  Negative for cough and shortness of breath.   Cardiovascular:  Negative for chest pain and leg swelling.  Gastrointestinal:  Negative for abdominal pain, nausea and vomiting.  Genitourinary:  Negative for flank pain.  Musculoskeletal:  Positive for back pain. Negative for neck pain.  Skin:  Negative for rash.  Neurological:  Negative for weakness, numbness and headaches.    Updated Vital Signs BP (!) 142/89 (BP Location: Right Arm)   Pulse 79   Temp 98.6 F (37 C) (Oral)   Resp 16   Ht 1.753 m (5' 9)   Wt 83 kg   SpO2 100%   BMI 27.02 kg/m   Physical Exam Vitals and nursing note reviewed.  Constitutional:      Appearance: Normal appearance. He is well-developed.  HENT:     Head: Atraumatic.     Nose: Nose normal.     Mouth/Throat:  Mouth: Mucous membranes are moist.  Eyes:     General: No scleral icterus.    Conjunctiva/sclera: Conjunctivae normal.  Neck:     Trachea: No tracheal deviation.  Cardiovascular:     Rate and Rhythm: Normal rate and regular rhythm.     Pulses: Normal pulses.     Heart sounds: Normal heart sounds. No murmur heard.    No friction rub. No gallop.  Pulmonary:     Effort: Pulmonary effort is normal. No accessory muscle usage or respiratory distress.     Breath sounds: Normal breath sounds.  Abdominal:     General: Bowel sounds are normal. There is no distension.      Palpations: Abdomen is soft. There is no mass.     Tenderness: There is no abdominal tenderness. There is no guarding.  Genitourinary:    Comments: No cva tenderness. Musculoskeletal:        General: No swelling.     Cervical back: Normal range of motion and neck supple. No rigidity or tenderness.     Comments: Mild mid thoracic tenderness diffusely, including muscular tenderness along medial borders of scapula, otherwise, CTLS spine, non tender, aligned, no step off.  No sts or skin changes noted.  Skin:    General: Skin is warm and dry.     Findings: No rash.  Neurological:     Mental Status: He is alert.     Comments: Alert, speech clear. Motor/sens grossly intact bil. Steady gait.   Psychiatric:        Mood and Affect: Mood normal.     (all labs ordered are listed, but only abnormal results are displayed) Labs Reviewed - No data to display  EKG: None  Radiology: DG Thoracic Spine 2 View Result Date: 05/02/2024 CLINICAL DATA:  Upper back pain when sleeping for 1.5 weeks. No acute injury. EXAM: THORACIC SPINE 2 VIEWS COMPARISON:  Chest radiographs 11/16/2020 FINDINGS: There are 12 rib-bearing thoracic type vertebral bodies. The alignment is normal. The disc spaces appear preserved. No evidence of acute fracture, widening of the interpedicular distance or paraspinal abnormality. Cholecystectomy clips noted. IMPRESSION: No acute osseous findings or significant spondylosis. No radiographic explanation for the patient's symptoms. Electronically Signed   By: Elsie Perone M.D.   On: 05/02/2024 11:33     Procedures   Medications Ordered in the ED  acetaminophen  (TYLENOL ) tablet 1,000 mg (1,000 mg Oral Given 05/02/24 1030)  ibuprofen  (ADVIL ) tablet 400 mg (400 mg Oral Given 05/02/24 1030)                                    Medical Decision Making Problems Addressed: Elevated blood pressure reading: acute illness or injury Upper back pain: acute illness or injury  Amount  and/or Complexity of Data Reviewed External Data Reviewed: notes. Radiology: ordered and independent interpretation performed. Decision-making details documented in ED Course.  Risk OTC drugs. Prescription drug management.   Imaging ordered.   Reviewed nursing notes and prior charts for additional history.    Pt periodically flexes/stretches shoulders/upper back area. +muscular tenderness ?spasm to periscapular musculature of area.   Acetaminophen  po, ibuprofen  po.  Xrays reviewed/interpreted by me - no fx or acute process.   Pt appears comfortable, and stable for ED d/c.   Rec pcp f/u.  Return precautions provided.      Final diagnoses:  Upper back pain  Elevated blood pressure reading  ED Discharge Orders     None          Bernard Drivers, MD 05/02/24 1200

## 2024-05-02 NOTE — ED Notes (Signed)
 All conversations with  pt was through the interpreter , Landy Eck with Arabic

## 2024-05-02 NOTE — ED Triage Notes (Signed)
 One and half weeks of  upper back pain when he tries to sleep  only at night abd  pain   x years some cramps occ diarrhea depends on what he is eating  hasn't seen a dr  no n/v at this time

## 2024-05-02 NOTE — ED Notes (Signed)
 All d/c instructions per  Arabic interpreter, pt states understanding of all
